# Patient Record
Sex: Female | Born: 1980 | Race: White | Hispanic: No | Smoking: Current every day smoker
Health system: Southern US, Community
[De-identification: ages and names within clinical notes are randomized; demographics above are authoritative.]

## PROBLEM LIST (undated history)

## (undated) DIAGNOSIS — F99 Mental disorder, not otherwise specified: Secondary | ICD-10-CM

## (undated) DIAGNOSIS — R87619 Unspecified abnormal cytological findings in specimens from cervix uteri: Secondary | ICD-10-CM

## (undated) DIAGNOSIS — G35 Multiple sclerosis: Secondary | ICD-10-CM

## (undated) DIAGNOSIS — F419 Anxiety disorder, unspecified: Secondary | ICD-10-CM

## (undated) DIAGNOSIS — D649 Anemia, unspecified: Secondary | ICD-10-CM

## (undated) DIAGNOSIS — B009 Herpesviral infection, unspecified: Secondary | ICD-10-CM

## (undated) DIAGNOSIS — G35D Multiple sclerosis, unspecified: Secondary | ICD-10-CM

## (undated) HISTORY — PX: DILATION AND CURETTAGE OF UTERUS: SHX78

## (undated) HISTORY — PX: KNEE SURGERY: SHX244

## (undated) HISTORY — DX: Herpesviral infection, unspecified: B00.9

## (undated) HISTORY — DX: Multiple sclerosis: G35

## (undated) HISTORY — DX: Unspecified abnormal cytological findings in specimens from cervix uteri: R87.619

## (undated) HISTORY — PX: CERVICAL BIOPSY  W/ LOOP ELECTRODE EXCISION: SUR135

## (undated) HISTORY — DX: Multiple sclerosis, unspecified: G35.D

---

## 1998-04-08 ENCOUNTER — Emergency Department (HOSPITAL_COMMUNITY): Admission: EM | Admit: 1998-04-08 | Discharge: 1998-04-08 | Payer: Self-pay | Admitting: Emergency Medicine

## 1998-08-15 ENCOUNTER — Inpatient Hospital Stay (HOSPITAL_COMMUNITY): Admission: AD | Admit: 1998-08-15 | Discharge: 1998-08-15 | Payer: Self-pay | Admitting: *Deleted

## 1998-08-15 ENCOUNTER — Emergency Department (HOSPITAL_COMMUNITY): Admission: EM | Admit: 1998-08-15 | Discharge: 1998-08-15 | Payer: Self-pay | Admitting: Emergency Medicine

## 1998-08-16 ENCOUNTER — Encounter: Payer: Self-pay | Admitting: Emergency Medicine

## 1998-11-05 ENCOUNTER — Emergency Department (HOSPITAL_COMMUNITY): Admission: EM | Admit: 1998-11-05 | Discharge: 1998-11-05 | Payer: Self-pay

## 1999-04-24 ENCOUNTER — Other Ambulatory Visit: Admission: RE | Admit: 1999-04-24 | Discharge: 1999-04-24 | Payer: Self-pay | Admitting: Obstetrics and Gynecology

## 2001-09-20 ENCOUNTER — Other Ambulatory Visit: Admission: RE | Admit: 2001-09-20 | Discharge: 2001-09-20 | Payer: Self-pay | Admitting: *Deleted

## 2002-07-09 ENCOUNTER — Emergency Department (HOSPITAL_COMMUNITY): Admission: EM | Admit: 2002-07-09 | Discharge: 2002-07-10 | Payer: Self-pay | Admitting: Emergency Medicine

## 2003-03-08 ENCOUNTER — Other Ambulatory Visit: Admission: RE | Admit: 2003-03-08 | Discharge: 2003-03-08 | Payer: Self-pay | Admitting: *Deleted

## 2004-04-15 ENCOUNTER — Other Ambulatory Visit: Admission: RE | Admit: 2004-04-15 | Discharge: 2004-04-15 | Payer: Self-pay | Admitting: *Deleted

## 2004-08-21 ENCOUNTER — Encounter: Admission: RE | Admit: 2004-08-21 | Discharge: 2004-08-21 | Payer: Self-pay | Admitting: Occupational Medicine

## 2005-03-04 ENCOUNTER — Other Ambulatory Visit: Admission: RE | Admit: 2005-03-04 | Discharge: 2005-03-04 | Payer: Self-pay | Admitting: *Deleted

## 2005-12-11 ENCOUNTER — Ambulatory Visit: Payer: Self-pay | Admitting: Internal Medicine

## 2006-01-27 ENCOUNTER — Encounter: Admission: RE | Admit: 2006-01-27 | Discharge: 2006-01-27 | Payer: Self-pay | Admitting: Neurology

## 2006-02-02 ENCOUNTER — Ambulatory Visit (HOSPITAL_COMMUNITY): Admission: RE | Admit: 2006-02-02 | Discharge: 2006-02-02 | Payer: Self-pay | Admitting: Neurology

## 2006-05-01 ENCOUNTER — Ambulatory Visit: Payer: Self-pay | Admitting: Internal Medicine

## 2006-05-21 ENCOUNTER — Ambulatory Visit: Payer: Self-pay | Admitting: Internal Medicine

## 2006-05-28 ENCOUNTER — Ambulatory Visit: Payer: Self-pay | Admitting: Internal Medicine

## 2006-07-28 ENCOUNTER — Ambulatory Visit: Payer: Self-pay | Admitting: Internal Medicine

## 2006-07-28 LAB — CONVERTED CEMR LAB
Albumin: 3.7 g/dL (ref 3.5–5.2)
Bilirubin, Direct: 0.1 mg/dL (ref 0.0–0.3)
Total Bilirubin: 0.6 mg/dL (ref 0.3–1.2)
Total Protein: 6.3 g/dL (ref 6.0–8.3)

## 2006-08-25 ENCOUNTER — Ambulatory Visit: Payer: Self-pay | Admitting: Internal Medicine

## 2007-11-10 ENCOUNTER — Telehealth (INDEPENDENT_AMBULATORY_CARE_PROVIDER_SITE_OTHER): Payer: Self-pay | Admitting: *Deleted

## 2009-09-06 ENCOUNTER — Ambulatory Visit: Payer: Self-pay | Admitting: Internal Medicine

## 2009-09-06 DIAGNOSIS — D692 Other nonthrombocytopenic purpura: Secondary | ICD-10-CM | POA: Insufficient documentation

## 2009-09-06 DIAGNOSIS — R091 Pleurisy: Secondary | ICD-10-CM | POA: Insufficient documentation

## 2009-09-06 DIAGNOSIS — F17200 Nicotine dependence, unspecified, uncomplicated: Secondary | ICD-10-CM | POA: Insufficient documentation

## 2009-09-07 LAB — CONVERTED CEMR LAB
Eosinophils Relative: 1.2 % (ref 0.0–5.0)
HCT: 50.1 % — ABNORMAL HIGH (ref 36.0–46.0)
Hemoglobin: 16.2 g/dL — ABNORMAL HIGH (ref 12.0–15.0)
Monocytes Absolute: 0.8 10*3/uL (ref 0.1–1.0)
Neutro Abs: 7.2 10*3/uL (ref 1.4–7.7)
WBC: 11 10*3/uL — ABNORMAL HIGH (ref 4.5–10.5)
aPTT: 27.2 s (ref 21.7–28.8)

## 2010-01-18 ENCOUNTER — Emergency Department (HOSPITAL_COMMUNITY): Admission: EM | Admit: 2010-01-18 | Discharge: 2010-01-19 | Payer: Self-pay | Admitting: Emergency Medicine

## 2010-01-19 ENCOUNTER — Inpatient Hospital Stay (HOSPITAL_COMMUNITY): Admission: AD | Admit: 2010-01-19 | Discharge: 2010-01-19 | Payer: Self-pay | Admitting: Psychiatry

## 2010-01-19 ENCOUNTER — Ambulatory Visit: Payer: Self-pay | Admitting: Psychiatry

## 2010-02-05 ENCOUNTER — Ambulatory Visit: Payer: Self-pay | Admitting: Internal Medicine

## 2010-02-05 DIAGNOSIS — L299 Pruritus, unspecified: Secondary | ICD-10-CM | POA: Insufficient documentation

## 2010-02-05 DIAGNOSIS — R5381 Other malaise: Secondary | ICD-10-CM | POA: Insufficient documentation

## 2010-02-05 DIAGNOSIS — R5383 Other fatigue: Secondary | ICD-10-CM

## 2010-02-05 DIAGNOSIS — K625 Hemorrhage of anus and rectum: Secondary | ICD-10-CM

## 2010-02-11 LAB — CONVERTED CEMR LAB
AST: 20 units/L (ref 0–37)
Creatinine, Ser: 0.7 mg/dL (ref 0.4–1.2)
Eosinophils Absolute: 0.1 10*3/uL (ref 0.0–0.7)
HCT: 43.8 % (ref 36.0–46.0)
Hemoglobin: 15.1 g/dL — ABNORMAL HIGH (ref 12.0–15.0)
MCHC: 34.4 g/dL (ref 30.0–36.0)
MCV: 94.7 fL (ref 78.0–100.0)
Monocytes Absolute: 0.3 10*3/uL (ref 0.1–1.0)
Monocytes Relative: 5.3 % (ref 3.0–12.0)
Neutrophils Relative %: 48.2 % (ref 43.0–77.0)
Platelets: 158 10*3/uL (ref 150.0–400.0)
Potassium: 4.4 meq/L (ref 3.5–5.1)
WBC: 5.3 10*3/uL (ref 4.5–10.5)

## 2010-03-12 ENCOUNTER — Ambulatory Visit: Payer: Self-pay | Admitting: Internal Medicine

## 2010-03-12 LAB — CONVERTED CEMR LAB
OCCULT 1: NEGATIVE
OCCULT 3: NEGATIVE

## 2010-03-13 ENCOUNTER — Encounter (INDEPENDENT_AMBULATORY_CARE_PROVIDER_SITE_OTHER): Payer: Self-pay | Admitting: *Deleted

## 2010-04-02 ENCOUNTER — Ambulatory Visit: Payer: Self-pay | Admitting: Internal Medicine

## 2010-04-12 ENCOUNTER — Ambulatory Visit: Payer: Self-pay | Admitting: Internal Medicine

## 2010-04-12 DIAGNOSIS — N760 Acute vaginitis: Secondary | ICD-10-CM | POA: Insufficient documentation

## 2010-04-12 DIAGNOSIS — A6000 Herpesviral infection of urogenital system, unspecified: Secondary | ICD-10-CM | POA: Insufficient documentation

## 2010-04-26 ENCOUNTER — Encounter (INDEPENDENT_AMBULATORY_CARE_PROVIDER_SITE_OTHER): Payer: Self-pay | Admitting: *Deleted

## 2010-04-29 ENCOUNTER — Telehealth (INDEPENDENT_AMBULATORY_CARE_PROVIDER_SITE_OTHER): Payer: Self-pay | Admitting: *Deleted

## 2010-08-22 ENCOUNTER — Ambulatory Visit
Admission: RE | Admit: 2010-08-22 | Discharge: 2010-08-22 | Payer: Self-pay | Source: Home / Self Care | Attending: Internal Medicine | Admitting: Internal Medicine

## 2010-08-22 DIAGNOSIS — J069 Acute upper respiratory infection, unspecified: Secondary | ICD-10-CM | POA: Insufficient documentation

## 2010-09-19 NOTE — Letter (Signed)
Summary: Primary Care Consult Scheduled Letter  [facility] [place]  [address] [place], [place] [address]   Phone: [phone]  Fax: [phone]      04/26/2010 MRN: [id number]  Kathryn Mccullough [address] [place], [place]  [address]    Dear [name],    We have scheduled an appointment for you.  At the recommendation of Dr. Marga Melnick, we have scheduled you a consult with [facility] on 05-29-2010 at 1:45pm.  Their address is [address] [place] [address]. The office phone number is [phone].  If this appointment day and time is not convenient for you, please feel free to call the office of the doctor you are being referred to at the number listed above and reschedule the appointment.    It is important for you to keep your scheduled appointments. We are here to make sure you are given good patient care.   Thank you,    Renee, Patient Care Coordinator [facility]

## 2010-09-19 NOTE — Assessment & Plan Note (Signed)
Summary: sinus problems/kn   Vital Signs:  Patient profile:   30 year old female Weight:      113.50 pounds (51.59 kg) Temp:     98.2 degrees F (36.78 degrees C) oral Pulse rate:   64 / minute Resp:     14 per minute BP sitting:   110 / 78  (left arm) Cuff size:   regular  Vitals Entered By: Brenton Grills MA (April 02, 2010 10:46 AM)  O2 Flow:  Room air CC: sinus problems/dark brown mucus/pt is currently not taking any medications/aj, URI symptoms   CC:  sinus problems/dark brown mucus/pt is currently not taking any medications/aj and URI symptoms.  History of Present Illness:  URI Symptoms      This is a 30 year old woman who presents with URI symptoms for 4-5 days .  The patient reports nasal congestion, brown  purulent nasal discharge, and sore throat, but denies earache.  Associated symptoms include fever, low-grade fever (<100.5 degrees) 1 day, and  occasional low grade wheezing.  The patient denies dyspnea.  The patient also reports itchy watery eyes and sneezing as initial symptoms , responsive to Benadryl.  The patient denies headache and severe fatigue.  Risk factors for Strep sinusitis include bilateral facial pain and bilateral nasal discharge.  The patient denies the following risk factors for Strep sinusitis: tooth pain. Smoking 1-2 ppd/ day.   Current Medications (verified): 1)  Hydroxyzine Pamoate 25 Mg Caps (Hydroxyzine Pamoate) .Marland Kitchen.. 1 Every 6 Hrs As Needed Itching  Allergies (verified): 1)  ! Wellbutrin  Physical Exam  General:  Thin ,in no acute distress; alert,appropriate and cooperative throughout examination Ears:  External ear exam shows no significant lesions or deformities.  Otoscopic examination reveals clear canals, tympanic membranes are intact bilaterally without bulging, retraction, inflammation or discharge. Hearing is grossly normal bilaterally.Minor TM scarring Nose:  External nasal examination shows no deformity or inflammation. Nasal mucosa are  pink and moist without lesions or exudates. Mouth:  Oral mucosa and oropharynx without lesions or exudates.  Teeth in good repair. Lungs:  Normal respiratory effort, chest expands symmetrically. Lungs are clear to auscultation, no crackles ; rare isolated low grade wheezes. Cervical Nodes:  Shootty LA Axillary Nodes:  No palpable lymphadenopathy   Impression & Recommendations:  Problem # 1:  SINUSITIS- ACUTE-NOS (ICD-461.9)  Her updated medication list for this problem includes:    Amoxicillin-pot Clavulanate 500-125 Mg Tabs (Amoxicillin-pot clavulanate) .Marland Kitchen... 1 every 12 hrs with a meal  Complete Medication List: 1)  Hydroxyzine Pamoate 25 Mg Caps (Hydroxyzine pamoate) .Marland Kitchen.. 1 every 6 hrs as needed itching 2)  Amoxicillin-pot Clavulanate 500-125 Mg Tabs (Amoxicillin-pot clavulanate) .Marland Kitchen.. 1 every 12 hrs with a meal  Patient Instructions: 1)  Neti pot once daily until sinuses are clear.Drink as much fluid as you can tolerate for the next few days. Consider Smoking Cessation participation @ [facility] [phone]. Prescriptions: AMOXICILLIN-POT CLAVULANATE 500-125 MG TABS (AMOXICILLIN-POT CLAVULANATE) 1 every 12 hrs with a meal  #20 x 0   Entered and Authorized by:   Marga Melnick MD   Signed by:   Marga Melnick MD on 04/02/2010   Method used:   Faxed to ...       [id number] (retail)       [address]       [place], [place]  [address]       Ph: [phone]       Fax: [phone]   RxID:   [id number]

## 2010-09-19 NOTE — Assessment & Plan Note (Signed)
Summary: EYES ARE RED/KN   Vital Signs:  Patient profile:   30 year old female Weight:      109.13 pounds (49.60 kg) Temp:     98.2 degrees F (36.78 degrees C) oral Resp:     14 per minute BP sitting:   90 / 60  (left arm)  Vitals Entered By: Lucious Groves CMA (August 22, 2010 12:12 PM) CC: C/O red eyes and previous infection still present./kb, Red eye, URI symptoms Is Patient Diabetic? No Pain Assessment Patient in pain? no      Comments Patient notes that her eyes have been really red, this began yesterday morning. She notes that she also still has her previous infection. Her symptoms include HA, facial pressure, and cough producing green mucous. She denies fever.   CC:  C/O red eyes and previous infection still present./kb, Red eye, and URI symptoms.  History of Present Illness:      This is a 30 year old woman who presents with Red eyes since 01/04.  The patient reports redness of both eyes, pain, blurring of vision, and photophobia, but denies loss of vision, discharge, and swelling.  The patient denies the following risk factors: contact lens usage and PMH of inflammatory bowel disease.  Precipitating events include recent RTI.  The patient reports productive cough with green sputum, but denies purulent nasal discharge, sore throat, and earache.  Associated symptoms include  some wheezing.  The patient denies fever and dyspnea.  The patient denies headache.  Risk factors for Strep sinusitis include bilateral facial pain and tooth pain ( but dental work needed).  The patient denies the following risk factors for Strep sinusitis: tender adenopathy.  Rx: Visine   Current Medications (verified): 1)  None  Allergies (verified): 1)  ! Wellbutrin  Physical Exam  General:  Thin ,in no acute distress; alert,appropriate and cooperative throughout examination Eyes:  No corneal or conjunctival inflammation noted. EOMI. Perrla.  Vision grossly normal. Ears:  External ear exam shows no  significant lesions or deformities.  Otoscopic examination reveals clear canals, tympanic membranes are intact bilaterally without bulging, retraction, inflammation or discharge. Hearing is grossly normal bilaterally. Nose:  External nasal examination shows no deformity or inflammation. Nasal mucosa are pink and moist without lesions or exudates. Mouth:  Oral mucosa and oropharynx without lesions or exudates. Minimal  pharyngeal erythema.   Lungs:  Normal respiratory effort, chest expands symmetrically. Lungs : mild rhonchi Heart:  Normal rate and regular rhythm. S1 and S2 normal without gallop, murmur, click, rub . Cervical Nodes:  Shotty , small LA Axillary Nodes:  No palpable lymphadenopathy   Impression & Recommendations:  Problem # 1:  BRONCHITIS-ACUTE (ICD-466.0) RAD component The following medications were removed from the medication list:    Metronidazole 500 Mg Tabs (Metronidazole) .Marland Kitchen... Three times a day ; avoid alcohol Her updated medication list for this problem includes:    Smz-tmp Ds 800-160 Mg Tabs (Sulfamethoxazole-trimethoprim) .Marland Kitchen... 1 two times a day with 8 oz water    Symbicort 80-4.5 Mcg/act Aero (Budesonide-formoterol fumarate) .Marland Kitchen... 1-2 puffs every 12 hrs; gargle & spit after use  Problem # 2:  URI (ICD-465.9) no clinica conjunctivitis  Complete Medication List: 1)  Smz-tmp Ds 800-160 Mg Tabs (Sulfamethoxazole-trimethoprim) .Marland Kitchen.. 1 two times a day with 8 oz water 2)  Symbicort 80-4.5 Mcg/act Aero (Budesonide-formoterol fumarate) .Marland Kitchen.. 1-2 puffs every 12 hrs; gargle & spit after use  Patient Instructions: 1)  Drink as much NON dairy  fluid as  you can tolerate for the next few days. Prescriptions: SYMBICORT 80-4.5 MCG/ACT AERO (BUDESONIDE-FORMOTEROL FUMARATE) 1-2 puffs every 12 hrs; gargle & spit after use  #1 x 0   Entered and Authorized by:   Marga Melnick MD   Signed by:   Marga Melnick MD on 08/22/2010   Method used:   Samples Given   RxID:    [id number] SMZ-TMP DS 800-160 MG TABS (SULFAMETHOXAZOLE-TRIMETHOPRIM) 1 two times a day with 8 oz water  #20 x 0   Entered and Authorized by:   Marga Melnick MD   Signed by:   Marga Melnick MD on 08/22/2010   Method used:   Electronically to        [facility] [address] (retail)       [phone]       [place], [place]  [address]       Ph: [phone]       Fax: [phone]   RxID:   [id number]    Orders Added: 1)  Est. Patient Level III [347[address]]

## 2010-09-19 NOTE — Letter (Signed)
Summary: Results Follow up Letter  [facility] [place]  [address] [place], [place] [address]   Phone: [phone]  Fax: [phone]    03/13/2010 MRN: [id number]  GENINE BECKETT [address] [place], [place]  [address]  Dear Ms. Bigbee,  The following are the results of your recent test(s):  Test         Result    Pap Smear:        Normal _____  Not Normal _____ Comments: ______________________________________________________ Cholesterol: LDL(Bad cholesterol):         Your goal is less than:         HDL (Good cholesterol):       Your goal is more than: Comments:  ______________________________________________________ Mammogram:        Normal _____  Not Normal _____ Comments:  ___________________________________________________________________ Hemoccult:        Normal __X___  Not normal _______ Comments:    _____________________________________________________________________ Other Tests:    We routinely do not discuss normal results over the telephone.  If you desire a copy of the results, or you have any questions about this information we can discuss them at your next office visit.   Sincerely,

## 2010-09-19 NOTE — Assessment & Plan Note (Signed)
Summary: er fu/ns/kdc   Vital Signs:  Patient profile:   30 year old female Weight:      107 pounds Temp:     98.5 degrees F oral Pulse rate:   56 / minute Resp:     14 per minute BP sitting:   110 / 78  (left arm) Cuff size:   regular  Vitals Entered By: Shonna Chock (September 06, 2009 11:07 AM)  CC: Emergency room([facility]) follow-up, patient was seen for SOB/Pain on her left side. Patient states she is better. Patient was Rx'ed Prednisone,Oxycodone and Zpak in the hospital Comments REVIEWED MED LIST, PATIENT AGREED DOSE AND INSTRUCTION CORRECT    CC:  Emergency room([facility]) follow-up, patient was seen for SOB/Pain on her left side. Patient states she is better. Patient was Rx'ed Prednisone, and Oxycodone and Zpak in the hospital.  History of Present Illness: Ms.Wickens was seen in [facility] ER 09/01/2009 for L pleurisy & SOB. CXray was ? negative. Zpack, steroids  & narcotic Rxed with almost complete resolution. Smokes 1-2 ppd, onset age 38. No PMH of asthma.  Allergies (verified): 1)  ! Wellbutrin  Review of Systems General:  Denies chills, fever, and sweats. ENT:  Denies nosebleeds. CV:  Denies chest pain or discomfort, difficulty breathing at night, leg cramps with exertion, shortness of breath with exertion, swelling of feet, and swelling of hands. Resp:  Denies chest pain with inspiration, coughing up blood, and shortness of breath; Minor wheezing occa. GI:  Denies bloody stools and dark tarry stools. GU:  Denies hematuria. Heme:  Complains of abnormal bruising; denies bleeding; Bruising after scratching with meds from ER.  Physical Exam  General:  Thin,in no acute distress; alert,appropriate and cooperative throughout examination Lungs:  Normal respiratory effort, chest expands symmetrically. Lungs are clear to auscultation, no crackles or wheezes. Heart:  Normal rate and regular rhythm. S1 and S2 normal without gallop, murmur, click, rub or other extra  sounds. Abdomen:  Bowel sounds positive,abdomen soft and non-tender without masses, organomegaly or hernias noted. Skin:  Scattered bruising on legs Cervical Nodes:  No lymphadenopathy noted Axillary Nodes:  No palpable lymphadenopathy Psych:  memory intact for recent and remote, normally interactive, and good eye contact.     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 As per pack    Chantix Continuing Month Pak 1 Mg Tabs (Varenicline tartrate) .Marland Kitchen... As per pack  Complete Medication List: 1)  Chantix Starting Month Pak 0.5 Mg X 11 & 1 Mg X 42 Tabs (Varenicline tartrate) .... As per pack 2)  Chantix Continuing Month Pak 1 Mg Tabs (Varenicline tartrate) .... As per pack  Patient Instructions: 1)  Avoid excess aspirin , ibuprofen , naproxen , etc due to bruising Prescriptions: CHANTIX CONTINUING MONTH PAK 1 MG TABS (VARENICLINE TARTRATE) as per pack  #1 x 1   Entered and Authorized by:   Marga Melnick MD   Signed by:   Marga Melnick MD on 09/06/2009   Method used:   Print then Give to  Patient   RxID:   [id number] CHANTIX STARTING MONTH PAK 0.5 MG X 11 & 1 MG X 42 TABS (VARENICLINE TARTRATE) as per pack  #1 x 0   Entered and Authorized by:   Marga Melnick MD   Signed by:   Marga Melnick MD on 09/06/2009   Method used:   Print then Give to Patient   RxID:   [id number]

## 2010-09-19 NOTE — Progress Notes (Signed)
Summary: Medication Concerns  Phone Note Call from Patient Call back at Home Phone [phone]   Caller: Patient Summary of Call: RX's (Acyclovir 200mg  and Metronidazole 500mg ) was given to patient on 04/12/2010 and patient walked in today and indicated med are too expensive ( not on 4 dollar list) and she would like them changed to something different.   Dr.Hopper please advise./Chrae Marlynn Perking CMA  April 29, 2010 2:38 PM   Follow-up for Phone Call        these are generic but needed for the problems she is having. See if [facility] card can be used or if independent drug store ( [facility] or [facility] ) are cheaper Follow-up by: Marga Melnick MD,  April 29, 2010 2:43 PM  Additional Follow-up for Phone Call Additional follow up Details #1::        I spoke with patient and she went to [facility] on [address] and generic Flagyl was 36 dollars and Acyclovir was 10dollas.  I called the pharmacy and they indicated that there 4 dollar list changes periodically and a new list is avaliable on there web site. Prices listed above are correct./Chrae [facility] CMA  April 29, 2010 3:22 PM     Additional Follow-up for Phone Call Additional follow up Details #2::    Patient will stop by and pick up [facility] [employer] discount prescription card along with her rx'e that she bought back in today   Chrae [facility] CMA  April 29, 2010 4:24 PM

## 2010-09-19 NOTE — Assessment & Plan Note (Signed)
Summary: FOR LICE ALL OVER--PH   Vital Signs:  Patient profile:   30 year old female Weight:      100.8 pounds Temp:     98.6 degrees F oral Pulse rate:   76 / minute Resp:     15 per minute BP sitting:   98 / 60  (left arm) Cuff size:   regular  Vitals Entered By: Shonna Chock (February 05, 2010 2:10 PM) CC: Lice, Fatigue Comments No current Rx'ed meds   CC:  Lice and Fatigue.  History of Present Illness: She has had diffuse pruritis  for several weeks which is progressing. "Tiny white things " noted after  scratching. Rx: none.She has PMH of Psoriasis ,but it has been quiescent.  FShe also  presents with Fatigue for 3 days.  The patient reports persistent fatigue, fatigue without physical limitations, and primarily physical fatigue.  The patient also reports cough.  The patient denies fever, night sweats, weight loss, exertional chest pain, hemoptysis, and new medications.  Other symptoms include daytime sleepiness and skin changes.  The patient denies the following symptoms: leg swelling, orthopnea, PND, melena, adenopathy, and severe snoring.  The patient denies feeling depressed, altered appetite, and poor sleep.  ?rectal bleeding last week   Allergies: 1)  ! Wellbutrin  Review of Systems Resp:  Complains of shortness of breath and wheezing; 1-2 ppd . Derm:  Denies rash; Facial dryness & exfoliation. Allergy:  Complains of itching eyes and sneezing; denies hives or rash; No angioedema but treated for sensation of swelling in mouth.  Physical Exam  General:  Very thin in no acute distress; alert,appropriate and cooperative throughout examination Eyes:  No corneal or conjunctival inflammation noted. No lid lag. Slight ptosis & anisocoria OS Neck:  No deformities, masses, or tenderness noted.. Thyroid full Lungs:  Normal respiratory effort, chest expands symmetrically. Lungs are clear to auscultation, no crackles or wheezes. Heart:  Normal rate and regular rhythm. S1 and S2 normal  without gallop, murmur, click, rub .S4 Abdomen:  Bowel sounds positive,abdomen soft and non-tender without masses, organomegaly or hernias noted. Dullness RUQ  Msk:  No deformity or scoliosis noted of thoracic or lumbar spine.   Extremities:  No clubbing, cyanosis, edema. Neurologic:  strength normal in all extremities and DTRs symmetrical and normal.  Fine tremor Skin:  Facial plethora Cervical Nodes:  No lymphadenopathy noted Axillary Nodes:  No palpable lymphadenopathy   Impression & Recommendations:  Problem # 1:  PRURITUS (ICD-698.9)  Problem # 2:  FATIGUE, ACUTE (ICD-780.79)  Orders: TLB-CBC Platelet - w/Differential (85025-CBCD) TLB-TSH (Thyroid Stimulating Hormone) (84443-TSH) TLB-ALT (SGPT) (84460-ALT) TLB-AST (SGOT) (84450-SGOT) TLB-Creatinine, Blood (82565-CREA) TLB-Potassium (K+) (84132-K) TLB-BUN (Urea Nitrogen) (84520-BUN)  Problem # 3:  RECTAL BLEEDING (ICD-569.3)  ? last week  Orders: TLB-CBC Platelet - w/Differential (85025-CBCD)  Complete Medication List: 1)  Hydroxyzine Pamoate 25 Mg Caps (Hydroxyzine pamoate) .Marland Kitchen.. 1 every 6 hrs as needed itching  Patient Instructions: 1)  Permethrin topically overnight ; may repeat in 14 days. Wash all bed clothing & clothes before reusing. Complete stool cards Prescriptions: HYDROXYZINE PAMOATE 25 MG CAPS (HYDROXYZINE PAMOATE) 1 every 6 hrs as needed itching  #30 x 0   Entered and Authorized by:   Marga Melnick MD   Signed by:   Marga Melnick MD on 02/05/2010   Method used:   Faxed to ...       [id number] (retail)       [address]  [place], [place]  [address]       Ph: [phone]       Fax: [phone]   RxID:   [id number]

## 2010-09-19 NOTE — Assessment & Plan Note (Signed)
Summary: WALK IN/REFERRAL NEEDED/LOST RX/KN   Vital Signs:  Patient profile:   30 year old female Weight:      101.4 pounds Temp:     98.5 degrees F oral Pulse rate:   76 / minute Resp:     15 per minute BP sitting:   100 / 66  (left arm) Cuff size:   regular  Vitals Entered By: Shonna Chock CMA (April 12, 2010 2:35 PM) CC: 1.) Patient with GYN problems and needs a referral 2.) Lost rx prescribed on 04/02/10 (needs new rx)   CC:  1.) Patient with GYN problems and needs a referral 2.) Lost rx prescribed on 04/02/10 (needs new rx).  History of Present Illness: Vaginal Discharge      This is a 30 year old woman who presents with Vaginal discharge for < 24 hrs.  The patient denies itching, vaginal burning, burning on urination, frequency, urgency, fever, pelvic pain, and back pain.  The discharge is described as yellow.  Risk factors for vaginal discharge include recent antibiotics, generic Augmentin.  Associated symptoms include genital sores and unusual vaginal bleeding as "color of dried  blood" 2 weeks ago before last period.  The patient denies the following symptoms: myalgias, arthralgias, and headache.  She has tried to enter [employer]; she makes $380 /week on unemployment. The sinusitis symptoms resolved after 3 days of the generic Augmentin. PMH of cervical dysplasia  treated by Dr Henderson Cloud in 2001.  PAPs subsequently by Dr Randell Patient  have been normal.  Current Medications (verified): 1)  Amoxicillin-Pot Clavulanate 500-125 Mg Tabs (Amoxicillin-Pot Clavulanate) .Marland Kitchen.. 1 Every 12 Hrs With A Meal  Allergies: 1)  ! Wellbutrin  Physical Exam  General:  Thin but in  no acute distress; alert,appropriate and cooperative throughout examination Abdomen:  Bowel sounds positive,abdomen soft  but slightly tender  suprapubic area without masses, organomegaly or hernias noted. Skin:  Abraded blister R groin. Multiple tatooes Cervical Nodes:  No lymphadenopathy noted Axillary Nodes:  No palpable  lymphadenopathy   Impression & Recommendations:  Problem # 1:  VAGINITIS (ICD-616.10)  The following medications were removed from the medication list:    Amoxicillin-pot Clavulanate 500-125 Mg Tabs (Amoxicillin-pot clavulanate) .Marland Kitchen... 1 every 12 hrs with a meal Her updated medication list for this problem includes:    Metronidazole 500 Mg Tabs (Metronidazole) .Marland Kitchen... Three times a day ; avoid alcohol  Orders: Misc. Referral (Misc. 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 Referral (Misc. Ref)  Complete Medication List: 1)  Acyclovir 200 Mg Caps (Acyclovir) .... As per instructions 2)  Metronidazole 500 Mg Tabs (Metronidazole) .... Three times a day ; avoid alcohol  Patient Instructions: 1)  Acyyclovir 200 mg 5X/day for 7 days. Keep a diary of outbreaks. Prescriptions: METRONIDAZOLE 500 MG TABS (METRONIDAZOLE) three times a day ; avoid alcohol  #42 x 0   Entered and Authorized by:   Marga Melnick MD   Signed by:   Marga Melnick MD on 04/12/2010   Method used:   Print then Give to Patient   RxID:   [id number] ACYCLOVIR 200 MG CAPS (ACYCLOVIR) as per instructions  #90 x 0   Entered and Authorized by:   Marga Melnick MD   Signed by:   Marga Melnick MD on 04/12/2010   Method used:   Print then Give to Patient   RxID:   [id number]

## 2010-09-26 ENCOUNTER — Encounter: Payer: Self-pay | Admitting: Obstetrics and Gynecology

## 2010-10-18 ENCOUNTER — Telehealth (INDEPENDENT_AMBULATORY_CARE_PROVIDER_SITE_OTHER): Payer: Self-pay | Admitting: *Deleted

## 2010-10-18 ENCOUNTER — Encounter: Payer: Self-pay | Admitting: Internal Medicine

## 2010-10-18 ENCOUNTER — Ambulatory Visit (INDEPENDENT_AMBULATORY_CARE_PROVIDER_SITE_OTHER): Payer: Self-pay | Admitting: Internal Medicine

## 2010-10-18 DIAGNOSIS — J209 Acute bronchitis, unspecified: Secondary | ICD-10-CM

## 2010-10-18 DIAGNOSIS — J019 Acute sinusitis, unspecified: Secondary | ICD-10-CM

## 2010-10-21 ENCOUNTER — Encounter (INDEPENDENT_AMBULATORY_CARE_PROVIDER_SITE_OTHER): Payer: Self-pay | Admitting: Occupational Therapy

## 2010-10-21 ENCOUNTER — Other Ambulatory Visit: Payer: Self-pay

## 2010-10-21 ENCOUNTER — Encounter: Payer: Self-pay | Admitting: Obstetrics and Gynecology

## 2010-10-21 DIAGNOSIS — N949 Unspecified condition associated with female genital organs and menstrual cycle: Secondary | ICD-10-CM

## 2010-10-21 DIAGNOSIS — Z01419 Encounter for gynecological examination (general) (routine) without abnormal findings: Secondary | ICD-10-CM

## 2010-10-21 LAB — CONVERTED CEMR LAB

## 2010-10-22 ENCOUNTER — Encounter: Payer: Self-pay | Admitting: Obstetrics and Gynecology

## 2010-10-22 LAB — CONVERTED CEMR LAB
Clue Cells Wet Prep HPF POC: NONE SEEN
Trich, Wet Prep: NONE SEEN

## 2010-10-22 IMAGING — CT CT HEAD W/O CM
1 series · 16 of 30 positions shown, 20 images · non-contrast
Comparison: MRI 01/27/2006

CLINICAL DATA: Blurred vision.  Confusion.  Lightheadedness.

CT HEAD WITHOUT CONTRAST
TECHNIQUE: Contiguous axial images were obtained from the base of
the skull through the vertex without contrast.

[Series 2: head_seq 4.5 h37s st · axial · 0.43mm/px · z∈[-116,+10]mm · 16 of 32 slices shown, 20 images]
[im 2/32  brain]
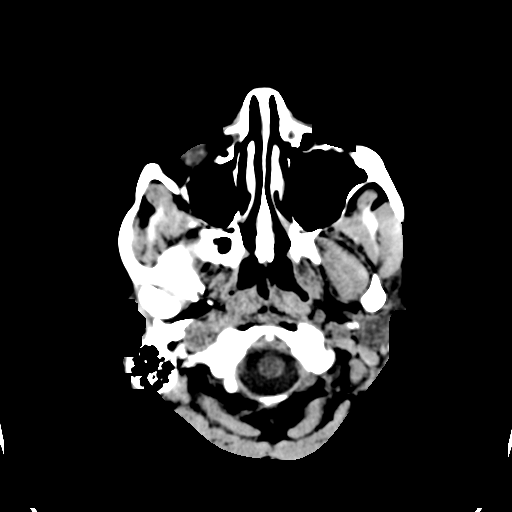
[im 2/32  bone]
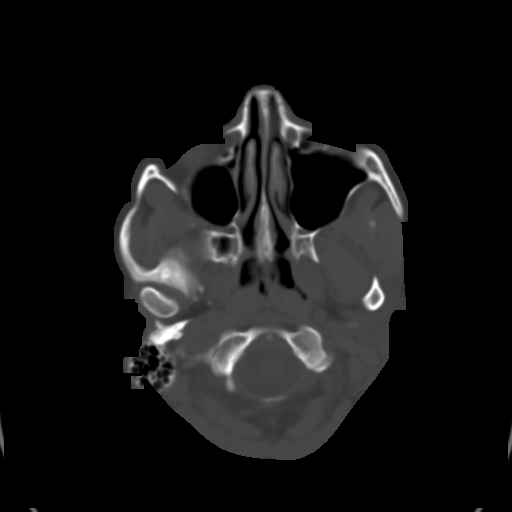
[im 4/32  brain]
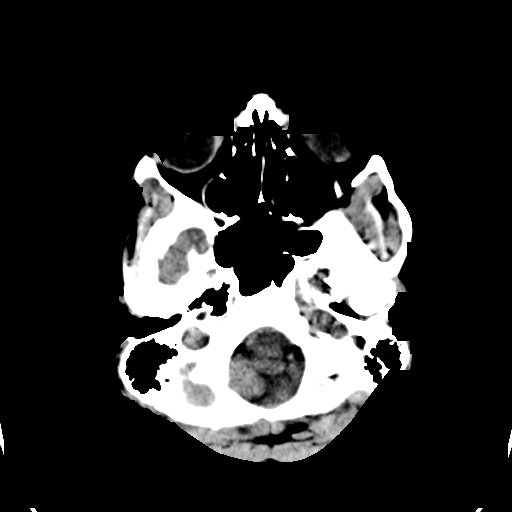
[im 6/32  brain]
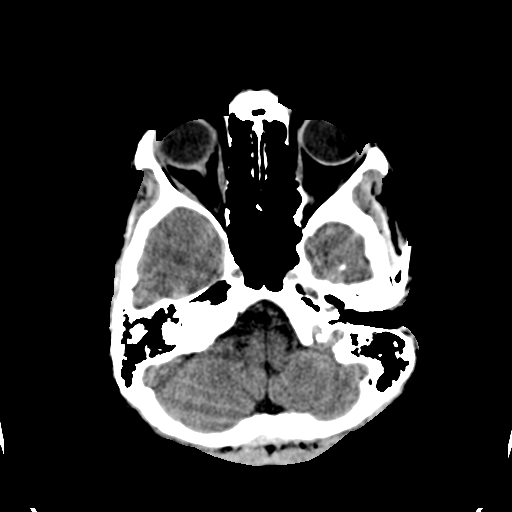
[im 8/32  brain]
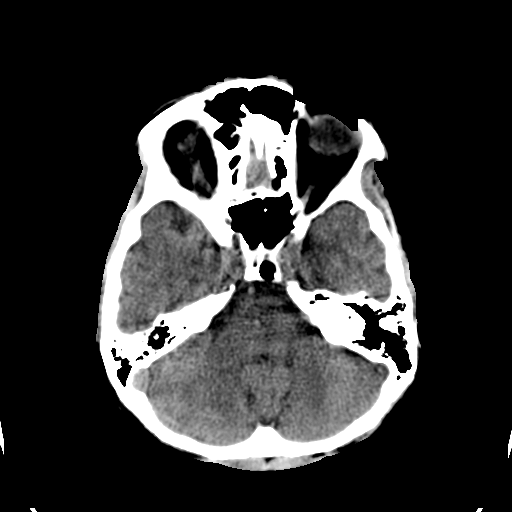
[im 9/32  brain]
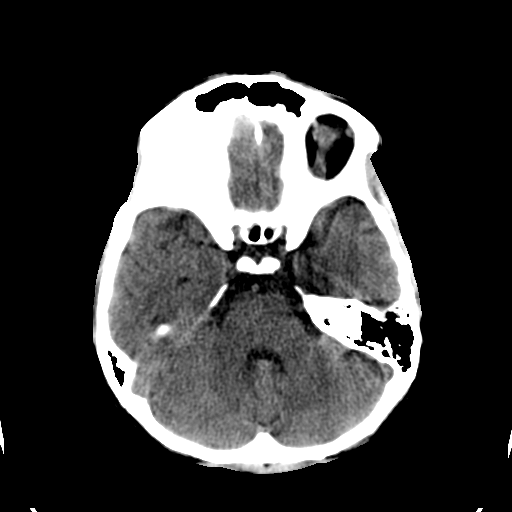
[im 9/32  bone]
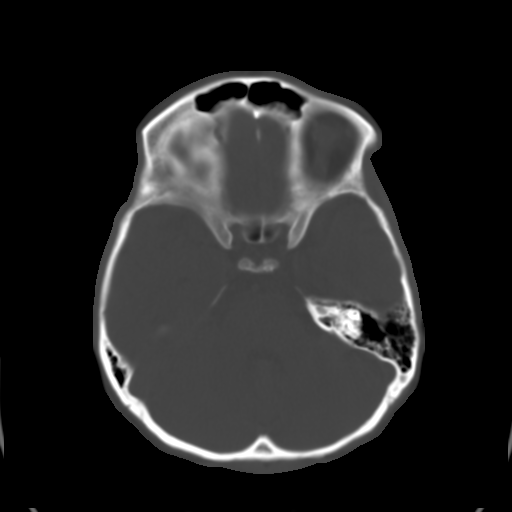
[im 11/32  brain]
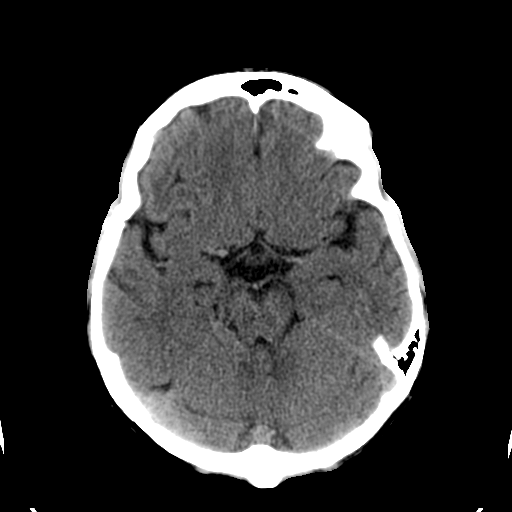
[im 13/32  brain]
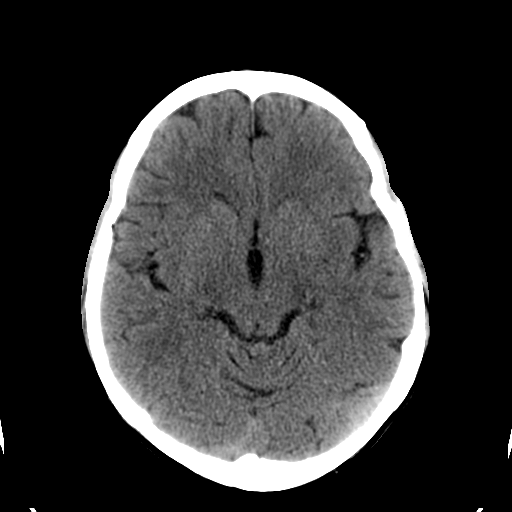
[im 15/32  brain]
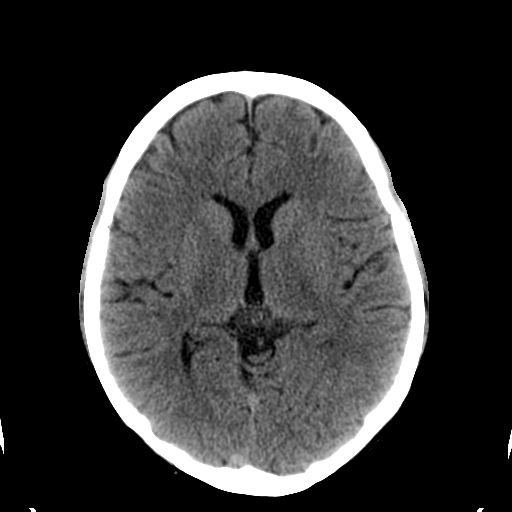
[im 17/32  brain]
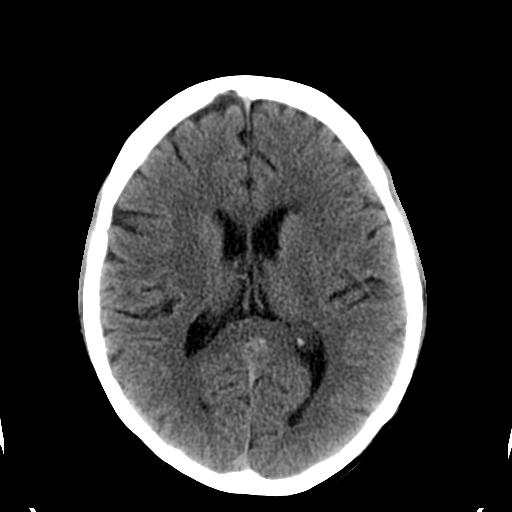
[im 17/32  bone]
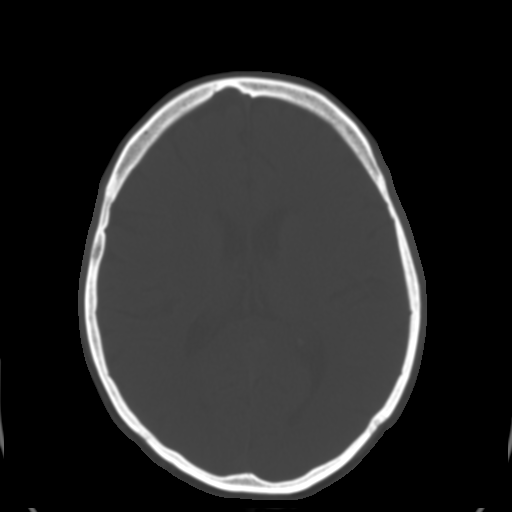
[im 19/32  brain]
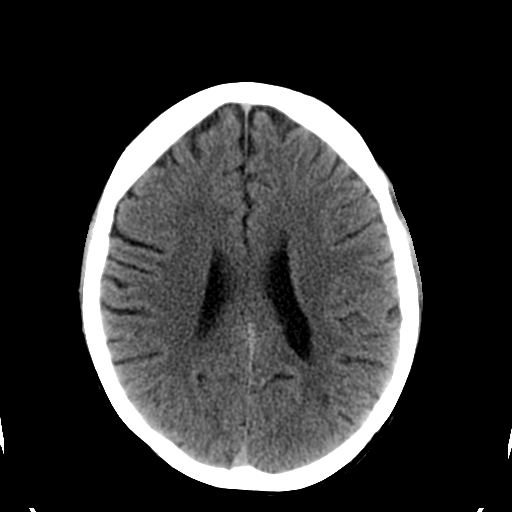
[im 21/32  brain]
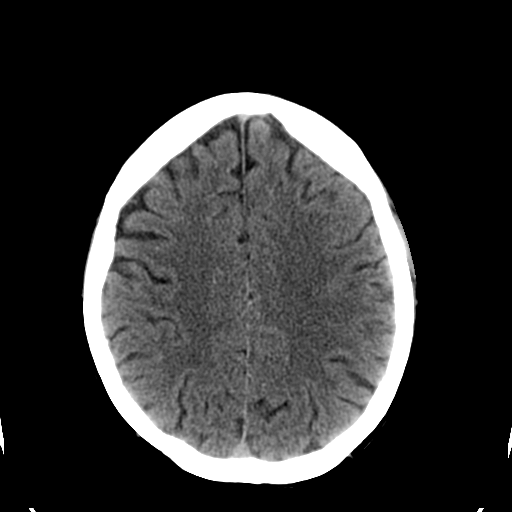
[im 23/32  brain]
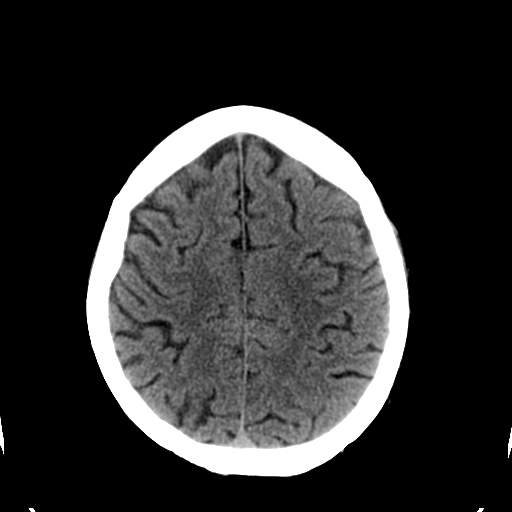
[im 24/32  brain]
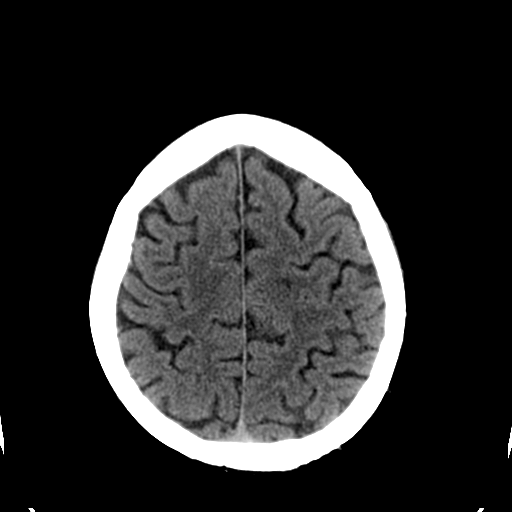
[im 24/32  bone]
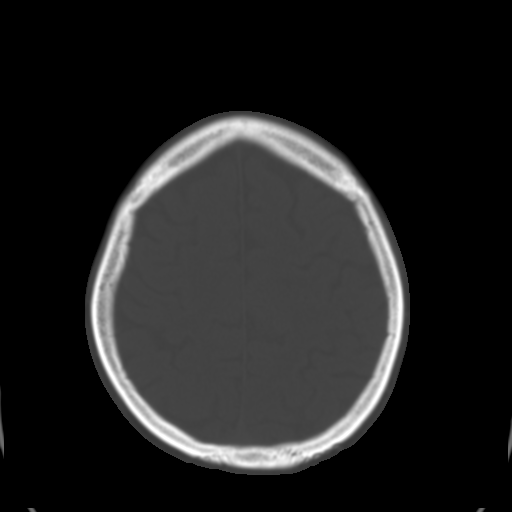
[im 26/32  brain]
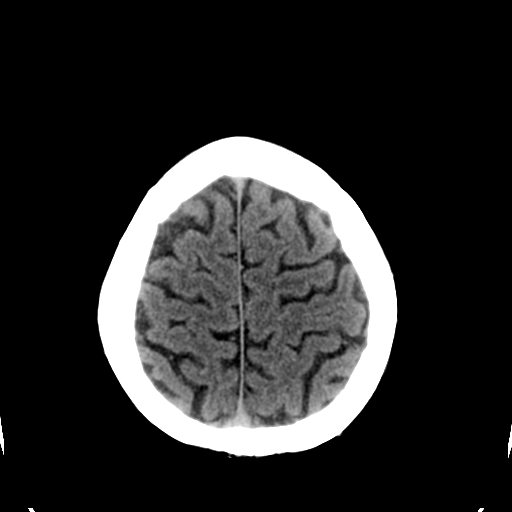
[im 28/32  brain]
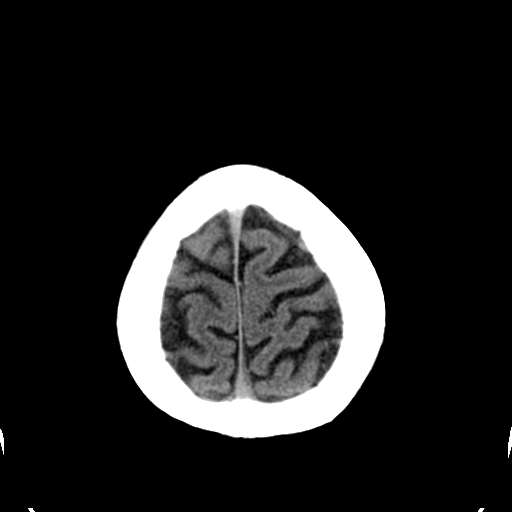
[im 30/32  brain]
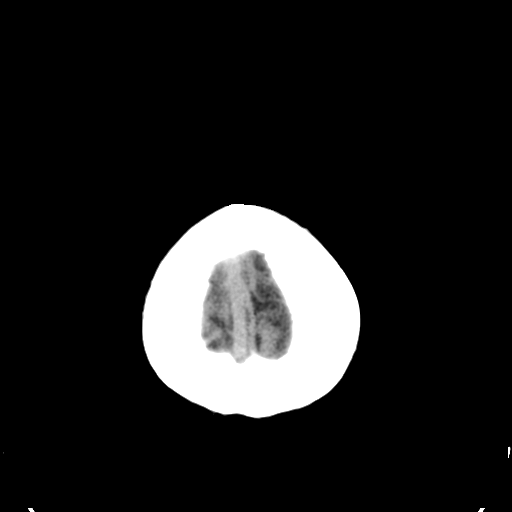

[16 of 30 positions shown; findings below may reference images not displayed]

FINDINGS: Proximal small subcortical low attenuation lesion in the
left posterior parietal lobe (image 19).  This may represent a
chronic lesion.  No intracranial hemorrhage, mass lesion, or acute
infarction.  Given the patient's age, there are mild cerebral
cortical atrophic changes.
IMPRESSION: Suspicion for small subcortical low attenuation focus in the left
posterior parietal lobe.  This may represent a chronic lesions such
as a chronic plaque or minimal small vessel disease.  No definite
acute intracranial abnormality.

## 2010-10-24 NOTE — Progress Notes (Signed)
Summary: sore thraot, fever and headache  Phone Note Call from Patient Call back at Home Phone [phone]   Caller: Patient Reason for Call: Privacy/Consent Authorization Summary of Call: called to see if Dr Alwyn Ren could call in a prescription for her--woke up this morning with terrible sore and swollen throat --has fever and headache---says this feels different than when she was sick before------she said she had seen Dr Alwyn Ren a lot recently and really didnt want to come back if she didnt need to, but I didnt find "lots of recent visits" listed---uses [facility] [address]  Please call [phone]   Initial call taken by: Jerolyn Shin,  October 18, 2010 11:41 AM  Follow-up for Phone Call        spoke w/ patient informed that she will need office visit appt scheduled for today 1:30pm.......Marland KitchenDoristine Devoid CMA  October 18, 2010 11:56 AM

## 2010-10-24 NOTE — Assessment & Plan Note (Signed)
Summary: sore throat, fever ,headache/cdj   Vital Signs:  Patient profile:   30 year old female Weight:      114.2 pounds Temp:     98.7 degrees F oral Pulse rate:   72 / minute Resp:     12 per minute BP sitting:   102 / 64  (left arm) Cuff size:   regular  Vitals Entered By: Shonna Chock CMA (October 18, 2010 2:21 PM) CC: Neck pain: swollen/tender and headache since yesterday, URI symptoms   CC:  Neck pain: swollen/tender and headache since yesterday and URI symptoms.  History of Present Illness:    Onset  upon awakening 10/17/2010 as temp of 99 & diffuse headache; she now  reports nasal congestion, purulent nasal discharge, sore throat & neck pain, and productive cough with yellow brown sputum. She  denies earache but is having pain ant  & post  to ears.  Associated symptoms include fever of 100.5-103 degrees,  slight dyspnea and wheezing.  The patient denies  frontal headache, bilateral facial pain, tooth pain, and tender adenopathy.  Rx: NSAIDS, green tea   Current Medications (verified): 1)  None  Allergies: 1)  ! Wellbutrin  Physical Exam  General:  Thin ,in no acute distress; appears fatigued,appropriate and cooperative throughout examination Ears:  External ear exam shows no significant lesions or deformities.  Otoscopic examination reveals clear canals, tympanic membranes are intact bilaterally without bulging, retraction, inflammation or discharge. Hearing is grossly normal bilaterally. Minor scar of TMS Nose:  External nasal examination shows no deformity or inflammation. Nasal mucosa are  mildly erythematous without lesions or exudates. Mouth:  Oral mucosa and oropharynx without lesions or exudates.  Teeth in good repair. Lungs:  Normal respiratory effort, chest expands symmetrically. Lungs are  essentially clear to auscultation; minimal  crackles  @ bases Heart:  Normal rate and regular rhythm. S1 and S2 normal without gallop, murmur, click, rub or other extra  sounds. Cervical Nodes:   Shotty  lymphadenopathy noted, L > R Axillary Nodes:  No palpable lymphadenopathy   Impression & Recommendations:  Problem # 1:  SINUSITIS- ACUTE-NOS (ICD-461.9)  The following medications were removed from the medication list:    Smz-tmp Ds 800-160 Mg Tabs (Sulfamethoxazole-trimethoprim) .Marland Kitchen... 1 two times a day with 8 oz water Her updated medication list for this problem includes:    Amoxicillin 500 Mg Caps (Amoxicillin) .Marland Kitchen... 1 three times a day    Fluticasone Propionate 50 Mcg/act Susp (Fluticasone propionate) .Marland Kitchen... 1 spray two times a day as needed  Problem # 2:  BRONCHITIS-ACUTE (ICD-466.0) Assessment: Unchanged  The following medications were removed from the medication list:    Smz-tmp Ds 800-160 Mg Tabs (Sulfamethoxazole-trimethoprim) .Marland Kitchen... 1 two times a day with 8 oz water    Symbicort 80-4.5 Mcg/act Aero (Budesonide-formoterol fumarate) .Marland Kitchen... 1-2 puffs every 12 hrs; gargle & spit after use Her updated medication list for this problem includes:    Amoxicillin 500 Mg Caps (Amoxicillin) .Marland Kitchen... 1 three times a day  Complete Medication List: 1)  Amoxicillin 500 Mg Caps (Amoxicillin) .Marland Kitchen.. 1 three times a day 2)  Fluticasone Propionate 50 Mcg/act Susp (Fluticasone propionate) .Marland Kitchen.. 1 spray two times a day as needed  Patient Instructions: 1)  Neti pot once daily - two times a day as needed until sinuses are clear. 2)  Drink as much NON dairy  fluid as you can tolerate for the next few days. Prescriptions: FLUTICASONE PROPIONATE 50 MCG/ACT SUSP (FLUTICASONE PROPIONATE) 1 spray two  times a day as needed  #1 x 5   Entered and Authorized by:   Marga Melnick MD   Signed by:   Marga Melnick MD on 10/18/2010   Method used:   Electronically to        [facility] [address] (retail)       [phone]       [place], [place]  [address]       Ph: [phone]       Fax: [phone]   RxID:   [id number] AMOXICILLIN 500  MG CAPS (AMOXICILLIN) 1 three times a day  #30 x 0   Entered and Authorized by:   Marga Melnick MD   Signed by:   Marga Melnick MD on 10/18/2010   Method used:   Electronically to        [facility] [address] [place] (retail)       [phone]       [place], [place]  [address]       Ph: [phone]       Fax: [phone]   RxID:   [id number]    Orders Added: 1)  Est. Patient Level III [95093]

## 2010-11-04 LAB — BASIC METABOLIC PANEL
BUN: 6 mg/dL (ref 6–23)
Chloride: 106 mEq/L (ref 96–112)
Creatinine, Ser: 0.7 mg/dL (ref 0.4–1.2)
GFR calc Af Amer: >60 mL/min (ref >60)
GFR calc non Af Amer: >60 mL/min (ref >60)
Glucose, Bld: 92 mg/dL (ref 70–99)

## 2010-11-04 LAB — URINALYSIS, ROUTINE W REFLEX MICROSCOPIC
Bilirubin Urine: NEGATIVE
Hgb urine dipstick: NEGATIVE
Specific Gravity, Urine: 1.008 (ref 1.005–1.030)
Urobilinogen, UA: 0.2 mg/dL (ref 0.0–1.0)
pH: 6 (ref 5.0–8.0)

## 2010-11-04 LAB — DIFFERENTIAL
Basophils Relative: 1 % (ref 0–1)
Eosinophils Absolute: 0.1 10*3/uL (ref 0.0–0.7)
Monocytes Absolute: 0.6 10*3/uL (ref 0.1–1.0)
Monocytes Relative: 8 % (ref 3–12)

## 2010-11-04 LAB — RAPID URINE DRUG SCREEN, HOSP PERFORMED
Amphetamines: NOT DETECTED
Barbiturates: NOT DETECTED
Benzodiazepines: NOT DETECTED
Tetrahydrocannabinol: NOT DETECTED

## 2010-11-04 LAB — CBC
Hemoglobin: 14.8 g/dL (ref 12.0–15.0)
MCV: 94.7 fL (ref 78.0–100.0)
WBC: 6.9 10*3/uL (ref 4.0–10.5)

## 2010-11-04 LAB — ETHANOL: Alcohol, Ethyl (B): <5 mg/dL (ref 0–10)
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
## 2010-11-28 ENCOUNTER — Other Ambulatory Visit (HOSPITAL_COMMUNITY)
Admission: RE | Admit: 2010-11-28 | Discharge: 2010-11-28 | Disposition: A | Discharge status: Home / Self Care | Payer: Self-pay | Source: Ambulatory Visit | Attending: Obstetrics and Gynecology | Admitting: Obstetrics and Gynecology

## 2010-11-28 ENCOUNTER — Other Ambulatory Visit: Payer: Self-pay | Admitting: Obstetrics & Gynecology

## 2010-11-28 ENCOUNTER — Encounter: Payer: Self-pay | Admitting: Obstetrics & Gynecology

## 2010-11-28 DIAGNOSIS — N871 Moderate cervical dysplasia: Secondary | ICD-10-CM | POA: Insufficient documentation

## 2010-11-28 DIAGNOSIS — R87613 High grade squamous intraepithelial lesion on cytologic smear of cervix (HGSIL): Secondary | ICD-10-CM

## 2010-11-28 LAB — POCT PREGNANCY, URINE: Preg Test, Ur: NEGATIVE

## 2010-12-12 ENCOUNTER — Ambulatory Visit (INDEPENDENT_AMBULATORY_CARE_PROVIDER_SITE_OTHER): Payer: Self-pay | Admitting: Obstetrics & Gynecology

## 2010-12-12 DIAGNOSIS — N871 Moderate cervical dysplasia: Secondary | ICD-10-CM

## 2011-01-02 ENCOUNTER — Other Ambulatory Visit: Payer: Self-pay | Admitting: Obstetrics & Gynecology

## 2011-01-02 ENCOUNTER — Encounter: Payer: Self-pay | Admitting: Family Medicine

## 2011-01-02 ENCOUNTER — Other Ambulatory Visit (HOSPITAL_COMMUNITY)
Admission: RE | Admit: 2011-01-02 | Discharge: 2011-01-02 | Disposition: A | Discharge status: Home / Self Care | Payer: Self-pay | Source: Ambulatory Visit | Attending: Family Medicine | Admitting: Family Medicine

## 2011-01-02 ENCOUNTER — Other Ambulatory Visit: Payer: Self-pay | Admitting: Family Medicine

## 2011-01-02 DIAGNOSIS — N871 Moderate cervical dysplasia: Secondary | ICD-10-CM | POA: Insufficient documentation

## 2011-01-02 DIAGNOSIS — R87613 High grade squamous intraepithelial lesion on cytologic smear of cervix (HGSIL): Secondary | ICD-10-CM

## 2011-01-02 LAB — POCT PREGNANCY, URINE: Preg Test, Ur: NEGATIVE

## 2011-01-03 NOTE — Op Note (Signed)
NAME:  Kathryn Mccullough, Kathryn Mccullough NO.:  [id number]   MEDICAL RECORD NO.:  [id number]          PATIENT TYPE:  OUT   LOCATION:  [facility]                          FACILITY:  [facility]   PHYSICIAN:  Genene Churn. Love, M.D.    DATE OF BIRTH:  13-Feb-1981   DATE OF PROCEDURE:  02/02/2006  DATE OF DISCHARGE:                                 OPERATIVE REPORT   PROCEDURE NOTE:  The patient was prepped and draped left lateral decubitus  position.  Betadine and 1% Xylocaine were used.  The L3-L4 interspace was  entered without difficulty.  Opening pressure was 140 mm of water.  Clear  colorless CSF was obtained and sent for VDRL, protein, glucose, cell count,  difficile, IgG, oligoclonal IgG, and angiotensin converting enzyme.  The  patient tolerated the procedure well.           ______________________________  Genene Churn. Sandria Manly, M.D.     JML/MEDQ  D:  02/02/2006  T:  02/02/2006  Job:  [id number]

## 2011-01-03 NOTE — Group Therapy Note (Signed)
NAME:  Kathryn Mccullough, Kathryn Mccullough NO.:  [id number]  MEDICAL RECORD NO.:  [id number]           PATIENT TYPE:  A  LOCATION:  [facility]                   FACILITY:  [facility]  PHYSICIAN:  Jaynie Collins, MD     DATE OF BIRTH:  1981/04/23  DATE OF SERVICE:  01/02/2011                                 CLINIC NOTE  REASON FOR VISIT:  LEEP.  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  Because of this, the LEEP was recommended.  The patient did come in on December 12, 2010, and we did watch the LEEP video and all her questions were answered.  She is here today for her scheduled procedure.  The patient did have a negative urine pregnancy test.  Prior to the procedure, the risks and benefits were reviewed and written informed consent was obtained.  PROCEDURE DETAILS:  The patient was kept in the dorsal lithotomy position and insulated speculum was then placed in the patient's vagina. Her cervix was visualized.  At this point, acetic acid was applied to her cervix and there were some areas of acetowhite that were noted corresponding to the areas that were seen on her colposcopy.  An intracervical block was administered using 10 mL of 2% lidocaine and then the LEEP procedure was carried out using 1X Fischer cone biopsy excisor to remove the transformation zone and also to satisfactory depth to make sure that most of the endocervix was also obtained.  The patient had minimal bleeding at the end of the procedure.  This was done under 50 watts of blend current.  After this was done, a rollerball was used to coagulate the edges and the base of the defect for excellent hemostasis.  Monsel was then used to also obtain further hemostasis. All instruments were then removed from the patient's vagina.  The patient  tolerated procedure well.  FOLLOWUP PLANS:  The patient was given the routine instructions for after a LEEP.  She was told that she will come back in 2 weeks to discuss results and likely will have another Pap smear in 4 months.  She would need to have q.6 month Pap smears after her next Pap smear until she has had 2 consecutive negative Pap.  The patient does verbalize understanding of this plan.  She was told that in any point if she had a normal Pap smear, she will have to have a colonoscopy and go through the whole process again.          ______________________________ Jaynie Collins, MD    UA/MEDQ  D:  01/02/2011  T:  01/03/2011  Job:  [id number]

## 2011-01-03 NOTE — Letter (Signed)
June 13, 2006    Genene Churn. Love, M.D.  [address]  [address]  [place], [place] [address]   RE:  Kathryn Mccullough, Kathryn Mccullough  MRN:  [id number]  /  DOB:  23-Sep-1980   Dear Rosanne Ashing,   Thank you for returning my call about Debbra Riding.  She is a delightful  young lady; unfortunately, we have had difficulty contacting her about blood  results.  I saw her on September 14 for bronchitic symptoms with purulent  sputum.  Unfortunately, she smokes 1 or 2 packs per day.  I questioned a  right lower lobe infiltrate on the film and placed her on Avelox.  I  discussed the pathophysiology of recurrent bronchitis and its relationship  to smoking with her and I told her I was extremely concerned about her  immune compromise with the multiple sclerosis and the high risk smoking  incurred.  I did give her a prescription for Chantix.  Previously, she had  tried Zyban but had developed a rash.   She requested labs at the time for followup of the Betaseron.  Her white  count was normal at 4700 and she was not anemic.  Her platelet count was  mildly reduced at 145,000.  She did have a lymphocytic shift with 49%  lymphocytes.  Her SGPT was 146 with an SGOT of 73.  Potassium, BUN, and  creatinine, T4, and TSH were all normal.   I dictated a note to her as we were unable to reach her at home or work.  She finally returned on October 4 for followup of her labs.  There had been  significant improvement in her bronchitic symptoms and she decreased her  smoking consumption to 15 cigarettes a day.  She states that she was 90%  better.  She stated that she did not have a followup scheduled with you at  that time.  She denied any change in color of her urine or stool.  She  described occasional nausea.  Alcohol intake was rare.   She had no icterus or jaundice.  She had some tenderness in the right upper  quadrant but no definite hepatomegaly.  She had no lymphadenopathy.   I recommended she avoid excess Tylenol, vitamin, and  alcohol.  Repeat lab  fasting lab studies were done on October 11 which revealed an SGOT of 95 and  an SGPT of 182, mildly elevated from September 14.  After out discussion, I  held on performing an ultrasound of the liver or hepatitis profile, as you  state you plan to discontinue the Betaseron.   If liver function tests elevations persists, then these studies could be  performed.   Thank you for your care of this nice young lady.    Sincerely,      Titus Dubin. Alwyn Ren, MD,FACP,FCCP    [facility]/MedQ  DD: 06/13/2006  DT: 06/15/2006  Job #: [id number]

## 2011-01-03 NOTE — Letter (Signed)
May 11, 2006     Matalynn Graff  [address]  [place], [place] [address]   RE:  KIMBRIA, CAMPOSANO  MRN:  [id number]  /  DOB:  [birth year]   Dear Jeanice Lim:   Our office has been unsuccessful in trying reach you at the numbers listed  in your chart. When they call your place of employment, your name is not on  the directory provided.   It is important to follow up the lab studies done on September 14 as the 2  liver function tests are significantly elevated. Specifically, your SGOT is  73 (normal less than 37) and SGPT 146 (normal less than 40). The blood count  suggested a possible recent viral process. Your thyroid function tests and  kidney function were normal.   Your bronchitis should have cleared with the Avelox but followup is needed  if your symptoms persist.   I will fax a copy of these to Dr. Melbourne Abts as well.   Please contact our office for followup.    Sincerely,     Titus Dubin. Alwyn Ren, MD,FACP,FCCP   [facility]/Med[facility]  DD:  05/11/2006  DT:  05/11/2006  Job #:  [id number]

## 2011-01-20 ENCOUNTER — Ambulatory Visit (INDEPENDENT_AMBULATORY_CARE_PROVIDER_SITE_OTHER): Payer: Self-pay | Admitting: Family Medicine

## 2011-01-20 DIAGNOSIS — R87613 High grade squamous intraepithelial lesion on cytologic smear of cervix (HGSIL): Secondary | ICD-10-CM

## 2011-01-21 NOTE — Group Therapy Note (Signed)
NAME:  Kathryn Mccullough, Kathryn Mccullough NO.:  [id number]  MEDICAL RECORD NO.:  [id number]           PATIENT TYPE:  A  LOCATION:  [facility]                   FACILITY:  [facility]  PHYSICIAN:  Lucina Mellow, DO   DATE OF BIRTH:  May 14, 1981  DATE OF SERVICE:  01/20/2011                                 CLINIC NOTE  The patient presents to the Gynecology Clinic the afternoon of January 20, 2011.  The patient presents for results of her LEEP procedure that was performed on Jan 02, 2011.  The patient states that her pain is controlled.  She had some cramping and pain after the procedure, but no longer is experiencing that.  She had some bleeding after the procedure which she feels was just her normal monthly period.  The bleeding has now stopped.  She states that she continues to have a thin clear discharge that is not odorous or irritating and she is not seeking treatment for this.  The patient continues to smoke, but has no other changes in her medical history.  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  These results were shared with the patient.  ASSESSMENT:  High-grade squamous intraepithelial lesion on loop electrocautery excision procedure.  The results are shared with the patient.  The recommendation is for her to repeat a Pap in 4 months. The patient will need to have close followup to determine if additional colposcopy or other procedure is necessary to treat her precancerous state.  The patient's questions were all answered and she leaves the clinic with understanding to make an appointment to have a Pap test follow up in approximately 4 months.          ______________________________ Lucina Mellow, DO    SH/MEDQ  D:  01/20/2011  T:  01/21/2011  Job:  [id number]

## 2011-04-08 ENCOUNTER — Telehealth: Payer: Self-pay | Admitting: *Deleted

## 2011-04-08 NOTE — Telephone Encounter (Signed)
Pt stated she wants a call back re: procedures scheduled and also she thinks she has a new infection- wants meds.

## 2011-04-08 NOTE — Telephone Encounter (Signed)
Spoke w/pt- she wanted to schedule her next appt- I scheduled for 05/29/11 @ 1330 for repeat Pap.  She also stated that she thinks she has a recurrence of BV- was treated in June for same. I told her that she would need an exam. Our next available appt is the middle of September. Pt will check w/her PCP to see if she can have appt sooner. Pt had no further questions.

## 2011-05-16 ENCOUNTER — Ambulatory Visit (INDEPENDENT_AMBULATORY_CARE_PROVIDER_SITE_OTHER): Payer: Self-pay | Admitting: Internal Medicine

## 2011-05-16 ENCOUNTER — Encounter: Payer: Self-pay | Admitting: Internal Medicine

## 2011-05-16 VITALS — BP 98/72 | HR 87 | Temp 98.5°F | Resp 16 | Wt 106.5 lb

## 2011-05-16 DIAGNOSIS — F411 Generalized anxiety disorder: Secondary | ICD-10-CM

## 2011-05-16 DIAGNOSIS — F419 Anxiety disorder, unspecified: Secondary | ICD-10-CM | POA: Insufficient documentation

## 2011-05-16 DIAGNOSIS — Z331 Pregnant state, incidental: Secondary | ICD-10-CM

## 2011-05-16 LAB — CBC WITH DIFFERENTIAL/PLATELET
Hemoglobin: 15.5 g/dL — ABNORMAL HIGH (ref 12.0–15.0)
Lymphs Abs: 2.5 10*3/uL (ref 0.7–4.0)
MCV: 92.6 fl (ref 78.0–100.0)
Monocytes Absolute: 0.5 10*3/uL (ref 0.1–1.0)
Neutro Abs: 2.8 10*3/uL (ref 1.4–7.7)
RBC: 4.96 Mil/uL (ref 3.87–5.11)

## 2011-05-16 LAB — BASIC METABOLIC PANEL
BUN: 10 mg/dL (ref 6–23)
CO2: 27 mEq/L (ref 19–32)
Calcium: 10.2 mg/dL (ref 8.4–10.5)
Chloride: 105 mEq/L (ref 96–112)
GFR: 85.74 mL/min (ref >60.00)
Glucose, Bld: 75 mg/dL (ref 70–99)
Potassium: 4.8 mEq/L (ref 3.5–5.1)

## 2011-05-16 MED ORDER — ALPRAZOLAM 0.5 MG PO TABS: 0.5000 mg | ORAL_TABLET | Freq: Four times a day (QID) | ORAL | Status: DC | PRN

## 2011-05-16 MED ORDER — ESCITALOPRAM OXALATE 10 MG PO TABS: 10.0000 mg | ORAL_TABLET | Freq: Every day | ORAL | Status: DC

## 2011-05-16 NOTE — Assessment & Plan Note (Addendum)
30 year old female, smoker presents with symptoms consistent with anxiety. Near fainty episodes likely related to emotional distress. EKG today--wnl Plan: Will treat anxiety with Lexapro and a low dose of Xanax for prn. Labs including a UPT (was neg) List of counselors provided If symptoms continue she may need further workup (echocardiogram etc.) Reassess in  3 weeks

## 2011-05-16 NOTE — Patient Instructions (Addendum)
ER if symptoms severe See a counselor Take medications as prescribed, can not take Xanax if pregnant. Come back in 3 weeks, call anytime you or not getting better

## 2011-05-16 NOTE — Progress Notes (Signed)
  Subjective:    Patient ID: Kathryn Mccullough, female    DOB: 04-17-1981, 30 y.o.   MRN: [id number]  HPI Chief complaint today is panic attacks Few days ago, she was in the shower, she felt that she could faint, eventually she vomited and felt dizzy. The next day at work, she felt dizzy and fainty, symptoms passed Yesterday had the most severe episode, she was driving, suddenly she couldn't breath, felt fainty, had palpitations, she was "tingly all over" and she feels extremely anxious. The anxiety symptoms lasted 30 minutes but the tingling all over lasted about an hour. She also experienced mild shooting chest pain which historically , she  experiences during times of high stress.  Past Medical History  Diagnosis Date  . Multiple sclerosis     per pt misdiagnose?  Marland Kitchen Abnormal Pap smear of cervix     LEEP 5-12   Past Surgical History  Procedure Date  . Knee surgery     arthoscopy   History   Social History  . Marital Status: Legally Separated    Spouse Name: N/A    Number of Children: 1  . Years of Education: N/A   Occupational History  . customer service     Social History Main Topics  . Smoking status: Current Everyday Smoker  . Smokeless tobacco: Not on file  . Alcohol Use: Yes     socially  . Drug Use: No  . Sexually Active: Not on file   Other Topics Concern  . Not on file   Social History Narrative   Lives w/ mother-step dad-brother    Review of Systems No headache No slurred speech, diplopia, facial paralysis or motor deficits. No fever or chills Weight stable within a few pounds. She admits to lots of stress lately: financially, personally and at work. Currently not taking any birth control    Objective:   Physical Exam  Constitutional: She is oriented to person, place, and time. She appears well-developed. No distress.       Slightly underweight.  Neck: No thyromegaly present.  Cardiovascular: Normal rate, regular rhythm and normal heart sounds.   No  murmur heard. Pulmonary/Chest: Effort normal and breath sounds normal. No respiratory distress. She has no wheezes. She has no rales.  Musculoskeletal: She exhibits no edema.  Neurological: She is alert and oriented to person, place, and time. No cranial nerve deficit. She exhibits normal muscle tone.  Skin: Skin is warm and dry. She is not diaphoretic.  Psychiatric:       Not depressed, slightly apprehensive.           Assessment & Plan:

## 2011-05-28 DIAGNOSIS — R87613 High grade squamous intraepithelial lesion on cytologic smear of cervix (HGSIL): Secondary | ICD-10-CM | POA: Insufficient documentation

## 2011-05-28 DIAGNOSIS — N871 Moderate cervical dysplasia: Secondary | ICD-10-CM | POA: Insufficient documentation

## 2011-05-29 ENCOUNTER — Ambulatory Visit: Payer: Self-pay | Admitting: Family Medicine

## 2011-05-29 ENCOUNTER — Encounter: Payer: Self-pay | Admitting: Obstetrics & Gynecology

## 2011-07-03 ENCOUNTER — Ambulatory Visit: Payer: Self-pay | Admitting: Physician Assistant

## 2011-10-30 ENCOUNTER — Encounter (HOSPITAL_COMMUNITY): Payer: Self-pay

## 2011-10-30 ENCOUNTER — Inpatient Hospital Stay (HOSPITAL_COMMUNITY)
Admission: AD | Admit: 2011-10-30 | Discharge: 2011-10-30 | Discharge status: Against Medical Advice | Payer: Self-pay | Source: Ambulatory Visit | Attending: Obstetrics & Gynecology | Admitting: Obstetrics & Gynecology

## 2011-10-30 DIAGNOSIS — N898 Other specified noninflammatory disorders of vagina: Secondary | ICD-10-CM

## 2011-10-30 DIAGNOSIS — N939 Abnormal uterine and vaginal bleeding, unspecified: Secondary | ICD-10-CM

## 2011-10-30 DIAGNOSIS — N946 Dysmenorrhea, unspecified: Secondary | ICD-10-CM | POA: Insufficient documentation

## 2011-10-30 HISTORY — DX: Anemia, unspecified: D64.9

## 2011-10-30 HISTORY — DX: Anxiety disorder, unspecified: F41.9

## 2011-10-30 LAB — WET PREP, GENITAL: Yeast Wet Prep HPF POC: NONE SEEN

## 2011-10-30 LAB — POCT PREGNANCY, URINE: Preg Test, Ur: NEGATIVE

## 2011-10-30 NOTE — MAU Provider Note (Signed)
History     CSN: [id number]  Arrival date & time 10/30/11  1104   None     No chief complaint on file.  HPI Kathryn Mccullough is a 31 y.o. female who presents to MAU for check up. At the first of the month she had a heavy period with severe cramping and thought maybe it was a miscarriage. Denies pain or bleeding now but came in to see if she needed a D&C. Called the clinic for appointment and told to come here. Had LEEP procedure in October and told to return every 3 months but has not been back. The history was provided by the patient.  Past Medical History  Diagnosis Date  . Multiple sclerosis     per pt misdiagnose?  Marland Kitchen Abnormal Pap smear of cervix     LEEP 5-12  . Herpes simplex without mention of complication   . Asthma     Past Surgical History  Procedure Date  . Knee surgery     arthoscopy  . Dilation and curettage of uterus     Family History  Problem Relation Age of Onset  . Coronary artery disease      GF MI 61s    History  Substance Use Topics  . Smoking status: Current Everyday Smoker -- 2.0 packs/day for 11 years  . Smokeless tobacco: Never Used  . Alcohol Use: Yes     socially    OB History    Grav Para Term Preterm Abortions TAB SAB Ect Mult Living   4 1 1  0 3 2 1  0 0 1      Review of Systems  Constitutional: Negative for fever, chills, diaphoresis and fatigue.  HENT: Negative for ear pain, congestion, sore throat, facial swelling, neck pain, neck stiffness, dental problem and sinus pressure.   Eyes: Negative for photophobia, pain and discharge.  Respiratory: Negative for cough, chest tightness and wheezing.   Cardiovascular: Negative.   Gastrointestinal: Negative for nausea, vomiting, abdominal pain, diarrhea, constipation and abdominal distention.  Genitourinary: Positive for frequency. Negative for dysuria, flank pain, vaginal bleeding, vaginal discharge, difficulty urinating and pelvic pain.  Musculoskeletal: Negative for myalgias, back pain and  gait problem.  Skin: Negative for color change and rash.  Neurological: Negative for dizziness, speech difficulty, weakness, light-headedness, numbness and headaches.  Psychiatric/Behavioral: Negative for confusion and agitation.    Allergies  Wellbutrin  Home Medications  No current outpatient prescriptions on file.  BP 120/66  Pulse 66  Temp(Src) 97.5 F (36.4 C) (Oral)  Resp 18  Ht 5\' 7"  (1.702 m)  Wt 109 lb 12.8 oz (49.805 kg)  BMI 17.20 kg/m2  SpO2 100%  LMP 10/17/2011  Physical Exam  Nursing note and vitals reviewed. Constitutional: She is oriented to person, place, and time. She appears well-developed and well-nourished.  HENT:  Head: Normocephalic.  Eyes: EOM are normal.  Neck: Neck supple.  Pulmonary/Chest: Effort normal.  Abdominal: Soft. There is no tenderness.  Genitourinary:       External genitalia without lesions. Cervical os with erythema and minimal bleeding, no adnexal tenderness. Uterus without palpable enlargement.   Musculoskeletal: Normal range of motion.  Neurological: She is alert and oriented to person, place, and time. No cranial nerve deficit.  Skin: Skin is warm and dry.  Psychiatric: She has a normal mood and affect. Her behavior is normal. Judgment and thought content normal.   Results for orders placed during the hospital encounter of 10/30/11 (from the past 24 hour(s))  POCT PREGNANCY, URINE     Status: Normal   Collection Time   10/30/11 11:38 AM      Component Value Range   Preg Test, Ur NEGATIVE  NEGATIVE     Assessment: Follow up of dysmenorrhea  Plan:  Make appointment in GYN Clinic for follow up for your pap smear   Return here as needed. ED Course  Procedures   MDM

## 2011-10-30 NOTE — MAU Note (Signed)
On the first ? Miscarriage.  But hadn't thought preg.  But since then stomach has gotten bigger, just came in to try to figure out what is going.  Had not done preg test.  Thought miscarriage because of the cramping, pain and bleeding she was having.

## 2011-10-30 NOTE — MAU Provider Note (Signed)
Attestation of Attending Supervision of Advanced Practitioner: Evaluation and management procedures were performed by the PA/NP/CNM/OB Fellow under my supervision/collaboration. Chart reviewed, and agree with management and plan.  Jaynie Collins, M.D. 10/30/2011 2:45 PM

## 2011-10-30 NOTE — Discharge Instructions (Signed)
Your pregnancy test was negative and your wet prep is normal. Follow up in the GYN Clinic for your pap. Return here as needed.

## 2011-10-31 LAB — GC/CHLAMYDIA PROBE AMP, GENITAL: Chlamydia, DNA Probe: NEGATIVE

## 2011-11-03 ENCOUNTER — Telehealth: Payer: Self-pay | Admitting: Internal Medicine

## 2011-11-03 NOTE — Telephone Encounter (Signed)
I need to know the name of the PA, NP or Gyn  Dr. she has been seeing to date, not just clinic name.

## 2011-11-03 NOTE — Telephone Encounter (Signed)
Left message to call office

## 2011-11-03 NOTE — Telephone Encounter (Signed)
Patient was referred to [facility] in 2011 by Dr. Alwyn Ren.  Patient calling today, asking for new referral elsewhere because she is very unhappy with all the service she has received there.  Patient is still without medical insurance, is aware she will be paying out of pocket up front at any other gynecology office.  Will you please enter new gyn referral for abnormal pap (done in ER) recently per patient.  Patient states she was told by the PA in ER that she may already have cervical cancer.

## 2011-11-03 NOTE — Telephone Encounter (Signed)
Dr.Hopper please advise 

## 2011-11-10 NOTE — Telephone Encounter (Signed)
I left 2nd message for patient to return call.

## 2011-11-11 ENCOUNTER — Ambulatory Visit (INDEPENDENT_AMBULATORY_CARE_PROVIDER_SITE_OTHER): Payer: Self-pay | Admitting: *Deleted

## 2011-11-11 VITALS — BP 100/65 | HR 74 | Temp 97.6°F | Ht 64.5 in | Wt 106.6 lb

## 2011-11-11 DIAGNOSIS — Z01419 Encounter for gynecological examination (general) (routine) without abnormal findings: Secondary | ICD-10-CM

## 2011-11-11 NOTE — Patient Instructions (Addendum)
Taught patient how to perform BSE. Let patient know will follow up with her within the next couple weeks with results and follow up will depend on her Pap smear report today. Patient verbalized understanding.

## 2011-11-11 NOTE — Progress Notes (Signed)
No complaints today.  Pap Smear:    Pap smear completed today. Patients last Pap smear was 10/21/10 and was HSIL. 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 Per patient had one abnormal Pap smear prior to the above Pap smear that required a repeat Pap smear that was normal. Patient has not followed up since LEEP procedure on 01/02/11. Pap smear results above are in EPIC.  Physical exam: Breasts Breasts symmetrical. No skin abnormalities bilateral breasts. No nipple retraction bilateral breasts. No nipple discharge bilateral breasts. No lymphadenopathy. No lumps palpated bilateral breasts. No complaints of pain or tenderness on palpation.          Pelvic/Bimanual   Ext Genitalia No lesions, no swelling and no discharge observed on external genitalia.         Vagina Vagina pink and normal texture. No lesions or discharge observed in vagina.          Cervix Cervix is present. Cervix darkened around os. Cervix friable. No discharge observed at cervical os.     Uterus Uterus is present and palpable. Uterus in normal position and normal size.        Adnexae Bilateral ovaries present and palpable. No tenderness on palpation.          Rectovaginal No rectal exam completed today since patient had no rectal complaints. No skin abnormalities observed on exam.

## 2011-12-03 ENCOUNTER — Telehealth: Payer: Self-pay | Admitting: *Deleted

## 2011-12-03 NOTE — Telephone Encounter (Signed)
Pt left message that she has not heard about her test results from 11/11/11.  She states that she does not have a phone that she can be called back on. She would like her results mailed to the address on file.  I forwarded this request to Fonnie Mu as the pt is from the [facility] program.

## 2011-12-08 ENCOUNTER — Encounter: Payer: Self-pay | Admitting: Obstetrics and Gynecology

## 2011-12-09 ENCOUNTER — Telehealth: Payer: Self-pay | Admitting: *Deleted

## 2011-12-09 NOTE — Telephone Encounter (Signed)
Telephoned patient at home # and left message to return call to [phone].

## 2011-12-24 ENCOUNTER — Telehealth: Payer: Self-pay | Admitting: Internal Medicine

## 2011-12-24 NOTE — Telephone Encounter (Signed)
There is no muscle relaxant on $4 list at [facility] or [facility]. Generic Flexeril 10 mg one half tabs a day as needed dispense 15

## 2011-12-24 NOTE — Telephone Encounter (Signed)
Dr.Hopper please advise 

## 2011-12-24 NOTE — Telephone Encounter (Signed)
Pt states she is having some problems with her MS and would like to see if Dr. Alwyn Ren will call her in some muscle relaxers. Pt states Dr. Alwyn Ren is aware of her financial situation and requests that the medication be on the $4 list at [facility] on [address]. Please call [phone] first, if no answer call [phone].

## 2011-12-25 MED ORDER — CYCLOBENZAPRINE HCL 10 MG PO TABS: 10.0000 mg | ORAL_TABLET | Freq: Three times a day (TID) | ORAL | Status: AC | PRN

## 2011-12-25 NOTE — Telephone Encounter (Signed)
Pt states that she will contact pharmacy to see what cheapest med is and give [place] a call back.

## 2011-12-25 NOTE — Telephone Encounter (Signed)
Refill done.  

## 2011-12-25 NOTE — Telephone Encounter (Signed)
Pt returned Felicia's call, however Sunny Schlein was unavailable at the time. Took msg from pt that the generic flexeril 10mg  1/2 tab per day will be fine. Call into [facility] on [address].

## 2011-12-25 NOTE — Telephone Encounter (Signed)
Tried to call Pt on cell VM states that Pt unavailable, left message with brother on home phone to return call.

## 2012-09-02 ENCOUNTER — Emergency Department (HOSPITAL_COMMUNITY)
Admission: EM | Admit: 2012-09-02 | Discharge: 2012-09-05 | Disposition: A | Discharge status: Home / Self Care | Payer: Self-pay | Attending: Emergency Medicine | Admitting: Emergency Medicine

## 2012-09-02 ENCOUNTER — Encounter (HOSPITAL_COMMUNITY): Payer: Self-pay | Admitting: Emergency Medicine

## 2012-09-02 DIAGNOSIS — Z8619 Personal history of other infectious and parasitic diseases: Secondary | ICD-10-CM | POA: Insufficient documentation

## 2012-09-02 DIAGNOSIS — F17200 Nicotine dependence, unspecified, uncomplicated: Secondary | ICD-10-CM | POA: Insufficient documentation

## 2012-09-02 DIAGNOSIS — Z8659 Personal history of other mental and behavioral disorders: Secondary | ICD-10-CM | POA: Insufficient documentation

## 2012-09-02 DIAGNOSIS — F29 Unspecified psychosis not due to a substance or known physiological condition: Secondary | ICD-10-CM | POA: Insufficient documentation

## 2012-09-02 DIAGNOSIS — Z8669 Personal history of other diseases of the nervous system and sense organs: Secondary | ICD-10-CM | POA: Insufficient documentation

## 2012-09-02 DIAGNOSIS — Z862 Personal history of diseases of the blood and blood-forming organs and certain disorders involving the immune mechanism: Secondary | ICD-10-CM | POA: Insufficient documentation

## 2012-09-02 HISTORY — DX: Mental disorder, not otherwise specified: F99

## 2012-09-02 LAB — CBC WITH DIFFERENTIAL/PLATELET
Basophils Absolute: 0 10*3/uL (ref 0.0–0.1)
Basophils Relative: 0 % (ref 0–1)
Eosinophils Relative: 2 % (ref 0–5)
HCT: 40.4 % (ref 36.0–46.0)
MCHC: 36.4 g/dL — ABNORMAL HIGH (ref 30.0–36.0)
MCV: 86.7 fL (ref 78.0–100.0)
Monocytes Absolute: 1.2 10*3/uL — ABNORMAL HIGH (ref 0.1–1.0)
Neutro Abs: 7.6 10*3/uL (ref 1.7–7.7)
RDW: 12.9 % (ref 11.5–15.5)

## 2012-09-02 LAB — COMPREHENSIVE METABOLIC PANEL WITH GFR
ALT: 16 U/L (ref 0–35)
AST: 12 U/L (ref 0–37)
Albumin: 4.3 g/dL (ref 3.5–5.2)
Alkaline Phosphatase: 38 U/L — ABNORMAL LOW (ref 39–117)
BUN: 17 mg/dL (ref 6–23)
CO2: 22 meq/L (ref 19–32)
Calcium: 9.4 mg/dL (ref 8.4–10.5)
Chloride: 103 meq/L (ref 96–112)
Creatinine, Ser: 0.59 mg/dL (ref 0.50–1.10)
GFR calc Af Amer: >90 mL/min
GFR calc non Af Amer: >90 mL/min
Glucose, Bld: 98 mg/dL (ref 70–99)
Potassium: 3.5 meq/L (ref 3.5–5.1)
Sodium: 137 meq/L (ref 135–145)
Total Bilirubin: 0.3 mg/dL (ref 0.3–1.2)
Total Protein: 6.9 g/dL (ref 6.0–8.3)

## 2012-09-02 LAB — RAPID URINE DRUG SCREEN, HOSP PERFORMED
Amphetamines: NOT DETECTED
Barbiturates: NOT DETECTED
Benzodiazepines: NOT DETECTED
Cocaine: NOT DETECTED
Opiates: NOT DETECTED
Tetrahydrocannabinol: NOT DETECTED

## 2012-09-02 LAB — SALICYLATE LEVEL: Salicylate Lvl: <2 mg/dL — ABNORMAL LOW (ref 2.8–20.0)

## 2012-09-02 LAB — ETHANOL: Alcohol, Ethyl (B): <11 mg/dL (ref 0–11)

## 2012-09-02 MED ORDER — NICOTINE 21 MG/24HR TD PT24
21.0000 mg | MEDICATED_PATCH | Freq: Every day | TRANSDERMAL | Status: DC
  Administered 2012-09-03: 21 mg via TRANSDERMAL
  Filled 2012-09-02 (×3): qty 1

## 2012-09-02 MED ORDER — WHITE PETROLATUM GEL
Status: AC
  Filled 2012-09-02: qty 5

## 2012-09-02 MED ORDER — ZOLPIDEM TARTRATE 5 MG PO TABS: 5.0000 mg | ORAL_TABLET | Freq: Every evening | ORAL | Status: DC | PRN

## 2012-09-02 MED ORDER — LORAZEPAM 1 MG PO TABS: 1.0000 mg | ORAL_TABLET | Freq: Three times a day (TID) | ORAL | Status: DC | PRN

## 2012-09-02 MED ORDER — ACETAMINOPHEN 325 MG PO TABS: 650.0000 mg | ORAL_TABLET | ORAL | Status: DC | PRN

## 2012-09-02 MED ORDER — ONDANSETRON HCL 4 MG PO TABS: 4.0000 mg | ORAL_TABLET | Freq: Three times a day (TID) | ORAL | Status: DC | PRN

## 2012-09-02 NOTE — ED Notes (Signed)
Pt presenting to ed with sheriff officers with ivc paperwork pt denies SI/HI at this time. Pt is calm and cooperative at this time.

## 2012-09-02 NOTE — ED Notes (Signed)
FAMILY JOHN ADAMS WOULD LIKE TO BE NOTIFIED UPON STATUS CHANGE @ [phone]

## 2012-09-02 NOTE — ED Provider Notes (Signed)
History  Scribed for Elpidio Anis, PA-C/ Dione Booze, MD, the patient was seen in room [facility]. This chart was scribed by Candelaria Stagers. The patient's care started at 5:34 PM   CSN: [id number]  Arrival date & time 09/02/12  1639   First MD Initiated Contact with Patient 09/02/12 1724      Chief Complaint  Patient presents with  . Medical Clearance     The history is provided by the patient. No language interpreter was used.   Kathryn Mccullough is a 32 y.o. female who presents to the Emergency Department with sheriff officers with IVC paperwork that her brother took out earlier today after an argument between the two of them.  Pt denies SI or HI.  She has no h/o HI.  Pt reports that when she was 32 years old she received psychiatric care for a suicide attempt.  Pt states that she is currently under a lot of stress.  She denies pain, substance abuse, or hallucinations.  IVC papers indicate that the pt has been experiencing paranoia and has recently told family members she soon will not be a burden to them.  She also recently thought she was meeting someone in the airport and when they were not there slept in the airport overnight.  When questioned about these issues pt denies paranoia.      Past Medical History  Diagnosis Date  . Multiple sclerosis     per pt misdiagnose?  Marland Kitchen Abnormal Pap smear of cervix     LEEP 5-12  . Herpes simplex without mention of complication   . Anemia   . Anxiety     Past Surgical History  Procedure Date  . Knee surgery     arthoscopy  . Dilation and curettage of uterus   . Cervical biopsy  w/ loop electrode excision     Family History  Problem Relation Age of Onset  . Coronary artery disease      GF MI 60s  . Anesthesia problems Neg Hx   . Hypotension Neg Hx   . Malignant hyperthermia Neg Hx   . Pseudochol deficiency Neg Hx   . Heart disease Paternal Grandfather   . Stroke Paternal Grandmother   . Heart disease Maternal Grandfather   .  Cancer Father     malignancy of eye  . Hypertension Mother   . Hypertension Brother     History  Substance Use Topics  . Smoking status: Current Every Day Smoker -- 1.0 packs/day for 11 years    Types: Cigarettes  . Smokeless tobacco: Never Used  . Alcohol Use: No     Comment: socially    OB History    Grav Para Term Preterm Abortions TAB SAB Ect Mult Living   4 1 1  0 3 2 1  0 0 1      Review of Systems  Constitutional: Negative for fever and chills.  Respiratory: Negative for shortness of breath.   Gastrointestinal: Negative for nausea and vomiting.  Neurological: Negative for weakness.  Psychiatric/Behavioral: Negative for suicidal ideas, hallucinations and self-injury. The patient is not nervous/anxious.   All other systems reviewed and are negative.    Allergies  Wellbutrin  Home Medications  No current outpatient prescriptions on file.  BP 111/70  Pulse 87  Temp 98.6 F (37 C) (Oral)  Resp 20  SpO2 100%  Physical Exam  Nursing note and vitals reviewed. Constitutional: She is oriented to person, place, and time. She appears well-developed and well-nourished.  No distress.  HENT:  Head: Normocephalic and atraumatic.  Eyes: EOM are normal.  Neck: Neck supple. No tracheal deviation present.  Cardiovascular: Normal rate.   Pulmonary/Chest: Effort normal. No respiratory distress.  Musculoskeletal: Normal range of motion.  Neurological: She is alert and oriented to person, place, and time.  Skin: Skin is warm and dry.  Psychiatric: She has a normal mood and affect. Her speech is normal and behavior is normal. She expresses no homicidal and no suicidal ideation.    ED Course  Procedures  DIAGNOSTIC STUDIES: Oxygen Saturation is 100% on room air, normal by my interpretation.    COORDINATION OF CARE:  6:03 PM Consult with Dr. Preston Fleeting who will evaluate the pt.  6:07 PM Dr. Preston Fleeting recommends tele psych be ordered for pt.     Labs Reviewed  CBC    COMPREHENSIVE METABOLIC PANEL  ETHANOL  URINE RAPID DRUG SCREEN (HOSP PERFORMED)   No results found.   No diagnosis found.    MDM  Patient to be moved to psych ED for full evaluation. I personally performed the services described in this documentation, which was scribed in my presence. The recorded information has been reviewed and is accurate.         Arnoldo Hooker, PA-C 09/02/12 1815

## 2012-09-02 NOTE — ED Provider Notes (Addendum)
32 year old female was brought in under involuntary commitment of which stated that she was having paranoid ideation. When I saw the patient, as she states it was all due to a misunderstanding with her brother and an argument that grew out of proportion. She says that she was supposed to meet some friends at the airport and her brother took her to the airport when the friends weren't there he got angry. Patient will not tell me who the friends are. She also states that friends have been playing pranks on her which her brother may have misconsttued as paranoid thoughts. When I ask her what these out pranks, she again refuses to give any details. I am concerned that she may be blocking me during the interview and psychiatric consultation will be obtained to determine whether she truly is paranoid and whether she needs to be maintained under involuntary commitment.  Medical screening examination/treatment/procedure(s) were conducted as a shared visit with non-physician practitioner(s) and myself.  I personally evaluated the patient during the encounter   Dione Booze, MD 09/02/12 1814  Dione Booze, MD 09/04/12 1032

## 2012-09-02 NOTE — ED Notes (Signed)
Report to Marlise Eves pt undressed in scrubs and wanded pt to [facility]

## 2012-09-03 ENCOUNTER — Encounter (HOSPITAL_COMMUNITY): Payer: Self-pay | Admitting: *Deleted

## 2012-09-03 NOTE — ED Notes (Signed)
Picked up breakfast tray 

## 2012-09-03 NOTE — ED Notes (Signed)
Pt ate 1/4 of breakfast.  Eating fruit on lunch tray now. Will cont to monitor.
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
## 2012-09-03 NOTE — ED Notes (Signed)
Pt ate 50 percent of lunch

## 2012-09-03 NOTE — ED Provider Notes (Signed)
Pt with stable vitals and labs, had tele-psych and inpatient tx reccomended, without medication recc.--placement pending  Kathryn Baker, MD 09/03/12 [phone]

## 2012-09-03 NOTE — ED Notes (Signed)
Psych MD called and spoke with RN

## 2012-09-03 NOTE — ED Notes (Signed)
Ate 90 percent of dinner.

## 2012-09-03 NOTE — ED Notes (Signed)
Up to the desk on the phone 

## 2012-09-03 NOTE — ED Notes (Signed)
Passed out lunch tray 

## 2012-09-03 NOTE — BH Assessment (Signed)
[facility] Assessment Progress Note      Presented client to Aggie N an extender for admission to the [facility] hall. She is not eating routinely and is described as thin and frail. She is said to be 85lbs on the IVC but the chart indicates she weighs 106lbs and is approx 5'7" per ACT person evaluating. Aggie is concerned about her intake and if she comes here and doesn't eat we are not able to force food or have her on unit with an IV. Discussed with Tori the [facility] and Aggie and she will be re run tomorrow after more information and assessment available on her intake.

## 2012-09-04 NOTE — ED Notes (Signed)
Pt ate 3/4 of breakfast and about 4 oz of orange juice.

## 2012-09-04 NOTE — ED Notes (Signed)
Pt taking shower.  

## 2012-09-04 NOTE — ED Provider Notes (Signed)
Kathryn Mccullough is a 32 y.o. female who is here under involuntary commitment by her brother. The patient does not have insight into her medical problem. She has been seen by psychiatry, who advised admission to a psychiatric facility. [facility] is seeking placement.  Flint Melter, MD 09/04/12 [phone]

## 2012-09-04 NOTE — ED Notes (Signed)
Patient is resting comfortably. 

## 2012-09-04 NOTE — ED Notes (Signed)
Up to the desk requesting a popcicle

## 2012-09-05 NOTE — ED Provider Notes (Addendum)
Patient continues to have very poor insight about why she is here. She is under involuntary commitment. She has been seen by Telepsych, who recommends inpatient admission. When talking to the psychiatrist she expressed delusions, and he noted  erratic activities.     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 They feel that she has changed and improved, and therefore, stable for discharge. They rescinded her involuntary commitment. She is to followup with community mental health.   Flint Melter, MD 09/05/12 [phone]

## 2012-09-05 NOTE — ED Notes (Signed)
Telepsych ordered to reassess pt. Pt admitted on 09-02-12. Pt does not express paranoid thoughts. Pt eating and drinking. Pt wants to go back home to brothers house, She is asking what her plans are. She said she is willing  to comply. She would just like to know what the plan is and go home if possible. Pt states she feels like she will be safe at home and there will not be any problems with brothers

## 2012-09-05 NOTE — ED Notes (Signed)
Pt reports that she made plans to go out of town with someone that was

## 2012-09-05 NOTE — ED Notes (Signed)
Up tot he bathroom to shower and change scrubs 

## 2012-09-05 NOTE — ED Notes (Signed)
telepsych complete 

## 2013-07-28 ENCOUNTER — Telehealth: Payer: Self-pay | Admitting: Internal Medicine

## 2013-07-28 NOTE — Telephone Encounter (Signed)
Caller: Florene/Patient; Phone: [phone]; Reason for Call: Vomiting, nausea and dizziness.  Patient refused to call 911 as instructed by screener.  While verifying demographics and history, the call dropped.  Attempted to contact patient at both numbers provided in the chart.  Unable to reestablish phone contact.  Note to office per Nursing judgment.

## 2013-07-28 NOTE — Telephone Encounter (Signed)
Attempted to return call to patient concerning dizziness. No answer. Message left to return call.

## 2013-07-28 NOTE — Telephone Encounter (Signed)
Spoke with the pt and informed her of Dr. Frederik Pear recommendation below.  Pt stated that she could not go to Urgent Care because she does not have any insurance.  Informed the pt that we scheduled her with Dr. Alwyn Ren on tomorrow @ 1:00pm.  Pt agreed.  Pt stated that last time she was seen she given assistance to help with her bill.  Informed the pt that I have a number she can call to get help with her bill.  Pt agreed.//AB/CMA

## 2013-07-28 NOTE — Telephone Encounter (Signed)
Patient Information:  Caller Name: Estee  Phone: [phone]  Patient: Kathryn Mccullough, Kathryn Mccullough  Gender: Female  DOB: [birth year]  Age: 32 Years  PCP: Marga Melnick  Pregnant: No  Office Follow Up:  Does the office need to follow up with this patient?: No  Instructions For The Office: N/A   Symptoms  Reason For Call & Symptoms: Nausea, no appetite and dizziness with standing/ feels very weak.  Onset 07/28/13.  She relates feeling worse after taking a nap.    Febrile/subjective - possibly for a few days due to having episodes of feeling hot. .  Weakness reported as worst symptom.  One episode of vomiting reported.  Last voided at 07:00; Intake estimated at 24 oz coffee and 8 oz  Vitamin C drink.  Emergent symptoms ruled otu.  See Today in Office  per Weakness and Fatigue guideline due to Patient wants to be seen.  Reviewed Health History In EMR: Yes  Reviewed Medications In EMR: Yes  Reviewed Allergies In EMR: Yes  Reviewed Surgeries / Procedures: Yes  Date of Onset of Symptoms: 07/28/2013  Treatments Tried: Sleep  Treatments Tried Worked: No OB / GYN:  LMP: 07/14/2013  Guideline(s) Used:  Weakness (Generalized) and Fatigue  Disposition Per Guideline:   See Today in Office  Reason For Disposition Reached:   Patient wants to be seen  Advice Given:  Reassurance  Weakness often accompanies viral illnesses (e.g., colds and flu).  The weakness is usually worse the first 3 days of the illness, then gets better.  A fever can make you feel weak.  Fever Medicines:  For fevers above 101 F (38.3 C) take either acetaminophen or ibuprofen.  Acetaminophen (e.g., Tylenol):  The most you should take each day is 3,000 mg (10 Regular Strength or 6 Extra Strength pills a day).  Ibuprofen (e.g., Motrin, Advil):  Take 400 mg (two 200 mg pills) by mouth every 6 hours.  Another choice is to take 600 mg (three 200 mg pills) by mouth every 8 hours.  The most you should take each day is 1,200 mg (six  200 mg pills), unless your doctor has told you to take more.  For All Fevers:  Drink cold fluids orally to prevent dehydration (Reason: good hydration replaces sweat and improves heat loss via skin). Adults should drink 6-8 glasses of water daily.  Dress in one layer of lightweight clothing and sleep with one light blanket.  Call Back If:  Unable to stand or walk  Passes out  Breathing difficulty occurs  You become worse.  RN Overrode Recommendation:  Document Patient  Unable to schedule appoitnment at this location, [place] or [place]. Patient wants to be seen as she hopes to go to work on 07/29/13.  Advised may go to [facility] for evaluation.

## 2013-07-28 NOTE — Telephone Encounter (Signed)
See if she can be worked in Friday 12/12. If her symptoms progress; she be seen at the [facility] urgent care tonight

## 2013-07-29 ENCOUNTER — Ambulatory Visit (INDEPENDENT_AMBULATORY_CARE_PROVIDER_SITE_OTHER): Payer: Self-pay | Admitting: Internal Medicine

## 2013-07-29 ENCOUNTER — Encounter: Payer: Self-pay | Admitting: Internal Medicine

## 2013-07-29 VITALS — BP 101/64 | HR 61 | Temp 98.1°F | Wt 100.6 lb

## 2013-07-29 DIAGNOSIS — R232 Flushing: Secondary | ICD-10-CM

## 2013-07-29 DIAGNOSIS — R002 Palpitations: Secondary | ICD-10-CM

## 2013-07-29 DIAGNOSIS — R112 Nausea with vomiting, unspecified: Secondary | ICD-10-CM

## 2013-07-29 DIAGNOSIS — R5381 Other malaise: Secondary | ICD-10-CM

## 2013-07-29 DIAGNOSIS — F411 Generalized anxiety disorder: Secondary | ICD-10-CM

## 2013-07-29 LAB — CBC WITH DIFFERENTIAL/PLATELET
Basophils Absolute: 0 10*3/uL (ref 0.0–0.1)
Eosinophils Absolute: 0.2 10*3/uL (ref 0.0–0.7)
Hemoglobin: 15.2 g/dL — ABNORMAL HIGH (ref 12.0–15.0)
Lymphocytes Relative: 36.2 % (ref 12.0–46.0)
MCHC: 33.9 g/dL (ref 30.0–36.0)
Monocytes Absolute: 0.5 10*3/uL (ref 0.1–1.0)
Neutro Abs: 3.2 10*3/uL (ref 1.4–7.7)
RDW: 13.5 % (ref 11.5–14.6)

## 2013-07-29 LAB — BASIC METABOLIC PANEL
CO2: 26 mEq/L (ref 19–32)
Calcium: 9.4 mg/dL (ref 8.4–10.5)
Creatinine, Ser: 0.7 mg/dL (ref 0.4–1.2)
Glucose, Bld: 75 mg/dL (ref 70–99)
Sodium: 138 mEq/L (ref 135–145)

## 2013-07-29 NOTE — Progress Notes (Signed)
Pre visit review using our clinic review tool, if applicable. No additional management support is needed unless otherwise documented below in the visit note. 

## 2013-07-29 NOTE — Patient Instructions (Addendum)
Please collect urine for 24 hours and return the total volume 12/15. Your next office appointment will be determined based upon review of your pending labs. Those instructions will be transmitted to you through My Chart  or by mail if you're not using this system. Followup as needed for your this acute issue. Please report any significant change in your symptoms.

## 2013-07-29 NOTE — Progress Notes (Signed)
Subjective:    Patient ID: Kathryn Mccullough, female    DOB: 01/30/1981, 32 y.o.   MRN: [id number]  HPI Over the past 7+ days she she has felt flushed & hot. She noted profound weakness and extreme fatigue as of 12/11. This was associated with nausea and vomiting x1. She stated she was actually too weak to stand. She felt feverish and had chills. She left her job & went home where she slept for hours. Subsequently after ingesting vitamin C powder in water she was able to finally eat which improved her symptoms dramatically. The 09/02/12 ED notes by Ms Sharol Harness describing paranoia & recommending [facility] care were reviewed. Initially she did not remember the events ; but then said "I don't speak to my brother since then". Apparently he took her to the ED for evaluation .  Review of Systems She describes intermittent blurred vision and consistent decrease in  visual acuity.  PMH of intermittent headaches; none reported the last several months. She denies a constellation of headache, sweating, and tachycardia. Her tachycardia does appear to be paroxysmal, typically occurring at rest  She does not associate flushing with episodes of diarrhea.  She's had intermittent numbness and tingling in her extremities, specifically her feet. She does not believe that this is related to the acute illness. She also has intermittently had some palpitations.  She describes significant anxiety & intermittent depression. She describes only 1 panic attack  She's had some sinus pressure without discolored nasal secretions. She's had no tinnitus or hearing loss. She also denies otic pain or otic discharge.  There's been no diplopia or dysphagia  She has had some intermittent dyspnea. She smokes 2 ppd.She does have cough without hemoptysis.  She denies any change in coordination or gait.        Objective:   Physical Exam  Gen.: Very thin . Alert, appropriate and cooperative throughout exam.Appears younger than stated age   Head: Normocephalic without obvious abnormalities Eyes: No corneal or conjunctival inflammation noted. Pupils equal round reactive to light and accommodation. Extraocular motion intact. No lid lag , proptosis or nystagmus Nose: External nasal exam reveals no deformity or inflammation. Nasal mucosa are pink and moist. No lesions or exudates noted.  Mouth: Oral mucosa and oropharynx reveal no lesions or exudates. Teeth in good repair. Neck: No deformities, masses, or tenderness noted. Range of motion & Thyroid normal. Lungs: Normal respiratory effort; chest expands symmetrically. Lungs are essentially clear to auscultation with only minor  Rales. No wheezes,rubs  or increased work of breathing. Heart: Normal rate and rhythm. Normal S1 and S2. No gallop, click, or rub. S4 w/o murmur. Abdomen: Bowel sounds normal; abdomen soft and nontender. No masses, organomegaly or hernias noted. Genitalia:  as per Gyn                                  Musculoskeletal/extremities: No deformity or scoliosis noted of  the thoracic or lumbar spine.   No clubbing, cyanosis, edema, or significant extremity  deformity noted. Range of motion normal .Tone & strength normal. Hand joints normal . Fingernail  health good. Able to lie down & sit up w/o help. Negative SLR bilaterally Vascular: Carotid, radial artery, dorsalis pedis and  posterior tibial pulses are full and equal. No bruits present. Neurologic: Alert and oriented x3. Deep tendon reflexes symmetrical and normal.  No tremor Gait normal   . Rhomberg & finger to  nose       Skin: Intact without suspicious lesions or rashes. Lymph: No cervical, axillary lymphadenopathy present. Psych: Mood and affect are normal. Normally interactive except when discussing 09/02/12 events.                                                                                       Assessment & Plan:  #1 paroxysmal severe fatigue with nausea and vomiting  #2 intermittent flushing,  palpitatitions  #3 anxiety and depression; isolated panic attack.Pheochromocytoma & carcinoid should be ruled out with this presentation.  See orders

## 2013-08-01 ENCOUNTER — Encounter: Payer: Self-pay | Admitting: *Deleted

## 2014-06-19 ENCOUNTER — Encounter: Payer: Self-pay | Admitting: Internal Medicine

## 2014-07-31 ENCOUNTER — Ambulatory Visit (INDEPENDENT_AMBULATORY_CARE_PROVIDER_SITE_OTHER): Payer: Self-pay | Admitting: Internal Medicine

## 2014-07-31 ENCOUNTER — Encounter: Payer: Self-pay | Admitting: Internal Medicine

## 2014-07-31 VITALS — BP 88/68 | HR 65 | Temp 98.3°F | Wt 100.2 lb

## 2014-07-31 DIAGNOSIS — Z720 Tobacco use: Secondary | ICD-10-CM

## 2014-07-31 DIAGNOSIS — R0781 Pleurodynia: Secondary | ICD-10-CM

## 2014-07-31 DIAGNOSIS — F17200 Nicotine dependence, unspecified, uncomplicated: Secondary | ICD-10-CM

## 2014-07-31 DIAGNOSIS — J209 Acute bronchitis, unspecified: Secondary | ICD-10-CM

## 2014-07-31 MED ORDER — HYDROCODONE-HOMATROPINE 5-1.5 MG/5ML PO SYRP: 5.0000 mL | ORAL_SOLUTION | Freq: Four times a day (QID) | ORAL | Status: AC | PRN

## 2014-07-31 MED ORDER — AZITHROMYCIN 250 MG PO TABS: ORAL_TABLET | ORAL | Status: AC

## 2014-07-31 NOTE — Progress Notes (Signed)
Pre visit review using our clinic review tool, if applicable. No additional management support is needed unless otherwise documented below in the visit note. 

## 2014-07-31 NOTE — Progress Notes (Signed)
   Subjective:    Patient ID: Kathryn Mccullough, female    DOB: 11/07/1980, 33 y.o.   MRN: [id number]  HPI     She's had a cough for 2 months; it has actually improved in the last 24 hours without any definitive treatment  She's describing pleuritic-type pain in the left inframammary area with deep breathing or coughing  The cough has been productive of clear-scant yellow sputum which is thick  She's also noted some wheezing  She has not taken any medications for this  She does describe some itchy, watery eyes.  Minor dental pain present attributed to dental disease..  She describes some chills and sweats.  She's had wheezing with the cough and shortness of breath  She smokes 1-2 packs per day and has smoked for 10 years  She has no other upper respiratory tract symptoms.   Review of Systems  Frontal headache, facial pain , nasal purulence, sore throat , otic pain or otic discharge denied. No fever . Last menses 3 weeks ago.       Objective:   Physical Exam  General appearance:thin but adequately nourished; no acute distress or increased work of breathing is present.  No  lymphadenopathy about the head, neck, or axilla noted.   Eyes: No conjunctival inflammation or lid edema is present. There is no scleral icterus.  Ears:  External ear exam shows no significant lesions or deformities.  Otoscopic examination reveals clear canals, tympanic membranes are intact bilaterally without bulging, retraction, inflammation or discharge.  Nose:  External nasal examination shows no deformity or inflammation. Nasal mucosa are erythematous without lesions or exudates. No septal dislocation or deviation.No obstruction to airflow.   Oral exam: Dental hygiene is fair; lips and gums are healthy appearing.There is no oropharyngeal erythema or exudate noted.   Neck:  No deformities, thyromegaly, masses, or tenderness noted.   Supple with full range of motion without pain.   Heart:  Normal rate  and regular rhythm. S1 and S2 normal without gallop, murmur, click, rub or other extra sounds.   Lungs:Breath sounds decreased. Splinting on L.Chest clear to auscultation; no wheezes, rhonchi,or rales .No rub present.No increased work of breathing.    Extremities:  No cyanosis, edema, or clubbing  noted    Skin: Warm & dry w/o jaundice or tenting.       Assessment & Plan:  #1 acute bronchitis w/o bronchospasm #2 pleuritic type pain #3smoker Plan: See orders and recommendations

## 2014-07-31 NOTE — Patient Instructions (Addendum)
Breo one inhalation daily; gargle and spit after use. Lot #:[id number] Exp 07/17 Chest Xray with your menses if symptoms persist after treatment. Please think about quitting smoking. Review the risks we discussed. Please call 1-800-QUIT-NOW ([phone]) for free smoking cessation counseling. Please take Aleve one to 2 every 8-12 hours with food as needed for the chest pain. Provide support for the rib cage if you are coughing

## 2014-08-01 ENCOUNTER — Telehealth: Payer: Self-pay | Admitting: Internal Medicine

## 2014-08-01 NOTE — Telephone Encounter (Signed)
emmi mailed  °

## 2014-08-14 ENCOUNTER — Telehealth: Payer: Self-pay | Admitting: Internal Medicine

## 2014-08-14 NOTE — Telephone Encounter (Signed)
Patient advised Breo sample is ready for her to pick up

## 2014-08-14 NOTE — Telephone Encounter (Signed)
Breo 1 inhalation daily  Sample in office

## 2014-08-14 NOTE — Telephone Encounter (Signed)
Pt called in and said that Dr Alwyn RenHopper gave her sample of inhaler last week and would like to know if she could come by and get another one?

## 2014-09-24 ENCOUNTER — Emergency Department (HOSPITAL_COMMUNITY)
Admission: EM | Admit: 2014-09-24 | Discharge: 2014-09-24 | Disposition: A | Discharge status: Home / Self Care | Payer: Self-pay | Attending: Emergency Medicine | Admitting: Emergency Medicine

## 2014-09-24 ENCOUNTER — Encounter (HOSPITAL_COMMUNITY): Payer: Self-pay | Admitting: Emergency Medicine

## 2014-09-24 DIAGNOSIS — Z202 Contact with and (suspected) exposure to infections with a predominantly sexual mode of transmission: Secondary | ICD-10-CM | POA: Insufficient documentation

## 2014-09-24 DIAGNOSIS — Z8619 Personal history of other infectious and parasitic diseases: Secondary | ICD-10-CM | POA: Insufficient documentation

## 2014-09-24 DIAGNOSIS — Z792 Long term (current) use of antibiotics: Secondary | ICD-10-CM | POA: Insufficient documentation

## 2014-09-24 DIAGNOSIS — Z862 Personal history of diseases of the blood and blood-forming organs and certain disorders involving the immune mechanism: Secondary | ICD-10-CM | POA: Insufficient documentation

## 2014-09-24 DIAGNOSIS — Z720 Tobacco use: Secondary | ICD-10-CM | POA: Insufficient documentation

## 2014-09-24 DIAGNOSIS — Z8659 Personal history of other mental and behavioral disorders: Secondary | ICD-10-CM | POA: Insufficient documentation

## 2014-09-24 DIAGNOSIS — Z9889 Other specified postprocedural states: Secondary | ICD-10-CM | POA: Insufficient documentation

## 2014-09-24 NOTE — Discharge Instructions (Signed)
Please follow up with your primary care physician in 1-2 days. If you do not have one please call the [facility] number listed above. Please keep your appointment with the health clinic. Please read all discharge instructions and return precautions.   Sexually Transmitted Disease A sexually transmitted disease (STD) is a disease or infection that may be passed (transmitted) from person to person, usually during sexual activity. This may happen by way of saliva, semen, blood, vaginal mucus, or urine. Common STDs include:   Gonorrhea.   Chlamydia.   Syphilis.   HIV and AIDS.   Genital herpes.   Hepatitis B and C.   Trichomonas.   Human papillomavirus (HPV).   Pubic lice.   Scabies.  Mites.  Bacterial vaginosis. WHAT ARE CAUSES OF STDs? An STD may be caused by bacteria, a virus, or parasites. STDs are often transmitted during sexual activity if one person is infected. However, they may also be transmitted through nonsexual means. STDs may be transmitted after:   Sexual intercourse with an infected person.   Sharing sex toys with an infected person.   Sharing needles with an infected person or using unclean piercing or tattoo needles.  Having intimate contact with the genitals, mouth, or rectal areas of an infected person.   Exposure to infected fluids during birth. WHAT ARE THE SIGNS AND SYMPTOMS OF STDs? Different STDs have different symptoms. Some people may not have any symptoms. If symptoms are present, they may include:   Painful or bloody urination.   Pain in the pelvis, abdomen, vagina, anus, throat, or eyes.   A skin rash, itching, or irritation.  Growths, ulcerations, blisters, or sores in the genital and anal areas.  Abnormal vaginal discharge with or without bad odor.   Penile discharge in men.   Fever.   Pain or bleeding during sexual intercourse.   Swollen glands in the groin area.   Yellow skin and eyes  (jaundice). This is seen with hepatitis.   Swollen testicles.  Infertility.  Sores and blisters in the mouth. HOW ARE STDs DIAGNOSED? To make a diagnosis, your health care provider may:   Take a medical history.   Perform a physical exam.   Take a sample of any discharge to examine.  Swab the throat, cervix, opening to the penis, rectum, or vagina for testing.  Test a sample of your first morning urine.   Perform blood tests.   Perform a Pap test, if this applies.   Perform a colposcopy.   Perform a laparoscopy.  HOW ARE STDs TREATED? Treatment depends on the STD. Some STDs may be treated but not cured.   Chlamydia, gonorrhea, trichomonas, and syphilis can be cured with antibiotic medicine.   Genital herpes, hepatitis, and HIV can be treated, but not cured, with prescribed medicines. The medicines lessen symptoms.   Genital warts from HPV can be treated with medicine or by freezing, burning (electrocautery), or surgery. Warts may come back.   HPV cannot be cured with medicine or surgery. However, abnormal areas may be removed from the cervix, vagina, or vulva.   If your diagnosis is confirmed, your recent sexual partners need treatment. This is true even if they are symptom-free or have a negative culture or evaluation. They should not have sex until their health care providers say it is okay. HOW CAN I REDUCE MY RISK OF GETTING AN STD? Take these steps to reduce your risk of getting an STD:  Use latex condoms, dental dams,  and water-soluble lubricants during sexual activity. Do not use petroleum jelly or oils.  Avoid having multiple sex partners.  Do not have sex with someone who has other sex partners.  Do not have sex with anyone you do not know or who is at high risk for an STD.  Avoid risky sex practices that can break your skin.  Do not have sex if you have open sores on your mouth or skin.  Avoid drinking too much alcohol or taking illegal  drugs. Alcohol and drugs can affect your judgment and put you in a vulnerable position.  Avoid engaging in oral and anal sex acts.  Get vaccinated for HPV and hepatitis. If you have not received these vaccines in the past, talk to your health care provider about whether one or both might be right for you.   If you are at risk of being infected with HIV, it is recommended that you take a prescription medicine daily to prevent HIV infection. This is called pre-exposure prophylaxis (PrEP). You are considered at risk if:  You are a man who has sex with other men (MSM).  You are a heterosexual man or woman and are sexually active with more than one partner.  You take drugs by injection.  You are sexually active with a partner who has HIV.  Talk with your health care provider about whether you are at high risk of being infected with HIV. If you choose to begin PrEP, you should first be tested for HIV. You should then be tested every 3 months for as long as you are taking PrEP.  WHAT SHOULD I DO IF I THINK I HAVE AN STD?  See your health care provider.   Tell your sexual partner(s). They should be tested and treated for any STDs.  Do not have sex until your health care provider says it is okay. WHEN SHOULD I GET IMMEDIATE MEDICAL CARE? Contact your health care provider right away if:   You have severe abdominal pain.  You are a man and notice swelling or pain in your testicles.  You are a woman and notice swelling or pain in your vagina. Document Released: 10/25/2002 Document Revised: 08/09/2013 Document Reviewed: 02/22/2013 [facility] Patient Information 2015 [place], [place]. This information is not intended to replace advice given to you by your health care provider. Make sure you discuss any questions you have with your health care provider.

## 2014-09-24 NOTE — ED Notes (Signed)
Pt reports she would like an AIDS test.

## 2014-09-24 NOTE — ED Provider Notes (Signed)
CSN: [id number]     Arrival date & time 09/24/14  2001 History  This chart was scribed for non-physician practitioner, Tyler DeisJen Romano Stigger, PA-C, working with Rolan BuccoMelanie Belfi, MD by Milly JakobJohn Lee Graves, ED Scribe. The patient was seen in room WTR8/WTR8. Patient's care was started at 8:20 PM.   Chief Complaint  Patient presents with  . Exposure to STD   The history is provided by the patient. No language interpreter was used.   HPI Comments: Kathryn Mccullough is a 34 y.o. female who presents to the Emergency Department complaining that she was threatened with a potential exposure to HIV. She denies fever, chills, or unintentional weight loss. She states that she would like to have her blood tested for HIV, but she has an appointment scheduled at the health clinic and she does not want a pelvic exam.   Past Medical History  Diagnosis Date  . Multiple sclerosis     per pt misdiagnose?  Marland Kitchen. Abnormal Pap smear of cervix     LEEP 5-12  . Herpes simplex without mention of complication   . Anemia   . Anxiety   . Mental disorder    Past Surgical History  Procedure Laterality Date  . Knee surgery      arthoscopy  . Dilation and curettage of uterus    . Cervical biopsy  w/ loop electrode excision     Family History  Problem Relation Age of Onset  . Coronary artery disease      GF MI 60s  . Anesthesia problems Neg Hx   . Hypotension Neg Hx   . Malignant hyperthermia Neg Hx   . Pseudochol deficiency Neg Hx   . Heart disease Paternal Grandfather   . Stroke Paternal Grandmother   . Heart disease Maternal Grandfather   . Cancer Father     malignancy of eye  . Hypertension Mother   . Hypertension Brother    History  Substance Use Topics  . Smoking status: Current Every Day Smoker -- 1.00 packs/day for 11 years    Types: Cigarettes  . Smokeless tobacco: Never Used  . Alcohol Use: Yes     Comment: socially   OB History    Gravida Para Term Preterm AB TAB SAB Ectopic Multiple Living   4 1 1  0 3 2 1   0 0 1     Review of Systems  Constitutional: Negative for fever, chills and unexpected weight change.  All other systems reviewed and are negative.   Allergies  Wellbutrin  Home Medications   Prior to Admission medications   Medication Sig Start Date End Date Taking? Authorizing Provider  azithromycin (ZITHROMAX Z-PAK) 250 MG tablet 2 day 1, then 1 qd 07/31/14   Pecola LawlessWilliam F Hopper, MD  HYDROcodone-homatropine (HYDROMET) 5-1.5 MG/5ML syrup Take 5 mLs by mouth every 6 (six) hours as needed for cough. 07/31/14   Pecola LawlessWilliam F Hopper, MD   Triage Vitals: BP 109/69 mmHg  Pulse 63  Temp(Src) 98.2 F (36.8 C) (Oral)  Resp 13  SpO2 100%  LMP 09/07/2014 Physical Exam  Constitutional: She is oriented to person, place, and time. She appears well-developed and well-nourished. No distress.  HENT:  Head: Normocephalic and atraumatic.  Right Ear: External ear normal.  Left Ear: External ear normal.  Nose: Nose normal.  Mouth/Throat: Oropharynx is clear and moist. No oropharyngeal exudate.  Eyes: Conjunctivae and EOM are normal.  Neck: Normal range of motion. Neck supple. No tracheal deviation present.  Cardiovascular: Normal rate, regular  rhythm and normal heart sounds.   Pulmonary/Chest: Effort normal and breath sounds normal. No respiratory distress.  Abdominal: Soft.  Musculoskeletal: Normal range of motion.  Neurological: She is alert and oriented to person, place, and time.  Skin: Skin is warm and dry. She is not diaphoretic.  Psychiatric: She has a normal mood and affect. Her behavior is normal.  Nursing note and vitals reviewed.   ED Course  Procedures (including critical care time) Medications - No data to display  DIAGNOSTIC STUDIES: Oxygen Saturation is 100% on room air, normal by my interpretation.    COORDINATION OF CARE: 8:24 PM-Discussed treatment plan which includes HIV antibody testing with pt at bedside and pt agreed to plan.   Labs Review Labs Reviewed  HIV  ANTIBODY (ROUTINE TESTING)  RPR    Imaging Review No results found.   EKG Interpretation None     Patient denies pelvic exam.  MDM   Final diagnoses:  Possible exposure to STD    Filed Vitals:   09/24/14 2007  BP: 109/69  Pulse: 63  Temp: 98.2 F (36.8 C)  Resp: 13   Afebrile, NAD, non-toxic appearing, AAOx4.   Patient requesting HIV test, declines pelvic examination. No acute physical exam findings. HIV and RPR sent, advised patient would receive a call for any positive test results. Advised she keep her appointment with health department for GC/Chlamydia testing. Return precautions discussed. Patient is agreeable to plan. Patient is stable at time of discharge.   I personally performed the services described in this documentation, which was scribed in my presence. The recorded information has been reviewed and is accurate.     Jeannetta Ellis, PA-C 09/24/14 2122  Rolan Bucco, MD 09/24/14 2156

## 2014-09-26 LAB — HIV ANTIBODY (ROUTINE TESTING W REFLEX): HIV Screen 4th Generation wRfx: NONREACTIVE

## 2014-09-26 LAB — RPR: RPR Ser Ql: NONREACTIVE

## 2014-09-27 ENCOUNTER — Telehealth: Payer: Self-pay | Admitting: Internal Medicine

## 2014-09-27 NOTE — Telephone Encounter (Addendum)
Patient would like the results of the 09/24/14 labs she took at the hospital.

## 2014-09-27 NOTE — Telephone Encounter (Signed)
Results given to her in sealed envelope

## 2015-08-03 ENCOUNTER — Telehealth: Payer: Self-pay | Admitting: Internal Medicine

## 2015-08-03 NOTE — Telephone Encounter (Signed)
Pt called in asking to leave a vm with you., told her i would take a message.  She wanted to talk to you about her medical records.  Not really clear what is was needing.  She is saying that medical records is going to release her records to another office and she has be arguing with them about it because she doesn't want them released.  I explained to her that you couldn't really help her with this but she insisted that I send the message.  She doesn't want her disability papers released to somebody...  Hop i am just sending this to you because he insisted that i do so.   I  Her number is [phone]

## 2015-08-03 NOTE — Telephone Encounter (Signed)
I believe Records can not be released w/o her permission unless it is agency such as  [employer] requesting records

## 2015-08-03 NOTE — Telephone Encounter (Signed)
Spoke to pt and she stated that she spoke to [place]. Patient states that she has been trying to get in touch with someone about adding a note to be wherry of releasing records. Patient had a consent form taken and she doesn't want her records to fall into the wrong hands.   Note added to pt chart.

## 2015-10-11 ENCOUNTER — Ambulatory Visit: Payer: Self-pay | Admitting: Family Medicine

## 2017-06-26 ENCOUNTER — Encounter (HOSPITAL_COMMUNITY): Payer: Self-pay
